# Patient Record
Sex: Female | Born: 1935 | Race: Black or African American | Hispanic: No | Smoking: Former smoker
Health system: Southern US, Community
[De-identification: ages and names within clinical notes are randomized; demographics above are authoritative.]

## PROBLEM LIST (undated history)

## (undated) DIAGNOSIS — N185 Chronic kidney disease, stage 5: Secondary | ICD-10-CM

## (undated) DIAGNOSIS — I739 Peripheral vascular disease, unspecified: Secondary | ICD-10-CM

## (undated) DIAGNOSIS — F0390 Unspecified dementia without behavioral disturbance: Secondary | ICD-10-CM

## (undated) DIAGNOSIS — R413 Other amnesia: Secondary | ICD-10-CM

## (undated) DIAGNOSIS — E119 Type 2 diabetes mellitus without complications: Secondary | ICD-10-CM

## (undated) DIAGNOSIS — I10 Essential (primary) hypertension: Secondary | ICD-10-CM

## (undated) DIAGNOSIS — H9190 Unspecified hearing loss, unspecified ear: Secondary | ICD-10-CM

## (undated) HISTORY — DX: Other amnesia: R41.3

## (undated) HISTORY — DX: Unspecified hearing loss, unspecified ear: H91.90

## (undated) HISTORY — PX: BACK SURGERY: SHX140

---

## 2004-12-20 ENCOUNTER — Ambulatory Visit: Payer: Self-pay | Admitting: Unknown Physician Specialty

## 2004-12-30 ENCOUNTER — Emergency Department: Payer: Self-pay | Admitting: Emergency Medicine

## 2004-12-30 ENCOUNTER — Other Ambulatory Visit: Payer: Self-pay

## 2005-01-16 ENCOUNTER — Ambulatory Visit: Payer: Self-pay | Admitting: Anesthesiology

## 2005-02-07 ENCOUNTER — Ambulatory Visit: Payer: Self-pay | Admitting: Anesthesiology

## 2005-03-07 ENCOUNTER — Ambulatory Visit: Payer: Self-pay | Admitting: Anesthesiology

## 2005-07-25 ENCOUNTER — Ambulatory Visit: Payer: Self-pay | Admitting: Family Medicine

## 2005-10-07 ENCOUNTER — Emergency Department: Payer: Self-pay | Admitting: Emergency Medicine

## 2005-12-03 ENCOUNTER — Ambulatory Visit: Payer: Self-pay | Admitting: Unknown Physician Specialty

## 2008-09-23 ENCOUNTER — Emergency Department: Payer: Self-pay | Admitting: Emergency Medicine

## 2008-09-30 ENCOUNTER — Ambulatory Visit: Payer: Self-pay | Admitting: Family Medicine

## 2009-01-31 ENCOUNTER — Ambulatory Visit: Payer: Self-pay | Admitting: Family Medicine

## 2009-02-21 ENCOUNTER — Ambulatory Visit: Payer: Self-pay | Admitting: Internal Medicine

## 2009-05-24 ENCOUNTER — Ambulatory Visit: Payer: Self-pay | Admitting: Family Medicine

## 2009-09-05 ENCOUNTER — Ambulatory Visit: Payer: Self-pay | Admitting: Internal Medicine

## 2010-03-19 ENCOUNTER — Ambulatory Visit: Payer: Self-pay | Admitting: Internal Medicine

## 2011-02-16 ENCOUNTER — Ambulatory Visit: Payer: Self-pay | Admitting: Family Medicine

## 2011-07-09 ENCOUNTER — Ambulatory Visit: Payer: Self-pay | Admitting: Internal Medicine

## 2011-07-09 ENCOUNTER — Observation Stay: Payer: Self-pay | Admitting: Internal Medicine

## 2011-12-20 ENCOUNTER — Emergency Department: Payer: Self-pay | Admitting: Unknown Physician Specialty

## 2011-12-20 LAB — CBC WITH DIFFERENTIAL/PLATELET
Basophil %: 0.4 %
Eosinophil #: 0 10*3/uL (ref 0.0–0.7)
HGB: 13.1 g/dL (ref 12.0–16.0)
Lymphocyte %: 26.6 %
MCHC: 32.6 g/dL (ref 32.0–36.0)
Neutrophil #: 3.5 10*3/uL (ref 1.4–6.5)
Platelet: 192 10*3/uL (ref 150–440)
RBC: 4.71 10*6/uL (ref 3.80–5.20)
RDW: 14.6 % — ABNORMAL HIGH (ref 11.5–14.5)
WBC: 5.1 10*3/uL (ref 3.6–11.0)

## 2011-12-20 LAB — URINALYSIS, COMPLETE
Nitrite: NEGATIVE
Ph: 5 (ref 4.5–8.0)
Protein: 30
RBC,UR: 23 /HPF (ref 0–5)
Specific Gravity: 1.025 (ref 1.003–1.030)
WBC UR: 105 /HPF (ref 0–5)

## 2011-12-20 LAB — BASIC METABOLIC PANEL
BUN: 25 mg/dL — ABNORMAL HIGH (ref 7–18)
Chloride: 103 mmol/L (ref 98–107)
EGFR (Non-African Amer.): 29 — ABNORMAL LOW
Glucose: 123 mg/dL — ABNORMAL HIGH (ref 65–99)
Potassium: 4.1 mmol/L (ref 3.5–5.1)
Sodium: 140 mmol/L (ref 136–145)

## 2013-02-17 ENCOUNTER — Emergency Department: Payer: Self-pay | Admitting: Unknown Physician Specialty

## 2013-09-08 LAB — COMPREHENSIVE METABOLIC PANEL
Anion Gap: 4 — ABNORMAL LOW (ref 7–16)
Bilirubin,Total: 0.4 mg/dL (ref 0.2–1.0)
Chloride: 108 mmol/L — ABNORMAL HIGH (ref 98–107)
EGFR (African American): 22 — ABNORMAL LOW
EGFR (Non-African Amer.): 19 — ABNORMAL LOW
Glucose: 124 mg/dL — ABNORMAL HIGH (ref 65–99)
Osmolality: 278 (ref 275–301)
Potassium: 4.2 mmol/L (ref 3.5–5.1)
Sodium: 136 mmol/L (ref 136–145)
Total Protein: 6.9 g/dL (ref 6.4–8.2)

## 2013-09-08 LAB — URINALYSIS, COMPLETE
Blood: NEGATIVE
Glucose,UR: NEGATIVE mg/dL (ref 0–75)
Nitrite: POSITIVE
Ph: 5 (ref 4.5–8.0)
Protein: 30
RBC,UR: 11 /HPF (ref 0–5)
Specific Gravity: 1.021 (ref 1.003–1.030)
WBC UR: 151 /HPF (ref 0–5)

## 2013-09-08 LAB — CBC
HGB: 10.9 g/dL — ABNORMAL LOW (ref 12.0–16.0)
MCH: 27.5 pg (ref 26.0–34.0)
MCHC: 33.4 g/dL (ref 32.0–36.0)
RBC: 3.96 10*6/uL (ref 3.80–5.20)
RDW: 16.6 % — ABNORMAL HIGH (ref 11.5–14.5)
WBC: 6.9 10*3/uL (ref 3.6–11.0)

## 2013-09-08 LAB — TROPONIN I: Troponin-I: <0.02 ng/mL

## 2013-09-09 ENCOUNTER — Inpatient Hospital Stay: Payer: Self-pay | Admitting: Internal Medicine

## 2013-09-10 LAB — CBC WITH DIFFERENTIAL/PLATELET
Basophil #: 0.1 10*3/uL (ref 0.0–0.1)
HGB: 11 g/dL — ABNORMAL LOW (ref 12.0–16.0)
Lymphocyte %: 25 %
MCH: 27.6 pg (ref 26.0–34.0)
MCHC: 33.8 g/dL (ref 32.0–36.0)
MCV: 82 fL (ref 80–100)
Neutrophil #: 3.6 10*3/uL (ref 1.4–6.5)
Platelet: 198 10*3/uL (ref 150–440)
RBC: 3.98 10*6/uL (ref 3.80–5.20)
RDW: 17.6 % — ABNORMAL HIGH (ref 11.5–14.5)
WBC: 5.8 10*3/uL (ref 3.6–11.0)

## 2013-09-10 LAB — BASIC METABOLIC PANEL
BUN: 25 mg/dL — ABNORMAL HIGH (ref 7–18)
Calcium, Total: 8.6 mg/dL (ref 8.5–10.1)
Co2: 24 mmol/L (ref 21–32)
Creatinine: 1.73 mg/dL — ABNORMAL HIGH (ref 0.60–1.30)
EGFR (African American): 29 — ABNORMAL LOW
EGFR (Non-African Amer.): 25 — ABNORMAL LOW
Glucose: 91 mg/dL (ref 65–99)
Potassium: 4.6 mmol/L (ref 3.5–5.1)

## 2013-09-23 ENCOUNTER — Encounter: Payer: Self-pay | Admitting: Podiatry

## 2013-09-28 ENCOUNTER — Ambulatory Visit (INDEPENDENT_AMBULATORY_CARE_PROVIDER_SITE_OTHER): Payer: Medicare Other | Admitting: Podiatry

## 2013-09-28 ENCOUNTER — Encounter: Payer: Self-pay | Admitting: Podiatry

## 2013-09-28 VITALS — BP 180/75 | HR 65 | Resp 16 | Ht 64.0 in | Wt 143.0 lb

## 2013-09-28 DIAGNOSIS — M79609 Pain in unspecified limb: Secondary | ICD-10-CM

## 2013-09-28 DIAGNOSIS — B351 Tinea unguium: Secondary | ICD-10-CM

## 2013-09-29 NOTE — Progress Notes (Signed)
Subjective:     Patient ID: Caroline Flores, female   DOB: 04-27-1920, 79 y.o.   MRN: [id number]  HPI patient presents stating I am having painful nails of both feet that I can not cut myself. Presents with caregiver   Review of Systems     Objective:   Physical Exam Neurovascular status unchanged from previous visit and thick incurvated nail bed 1-5 both feet    Assessment:     Mycotic nail infection with pain 1-5 both feet    Plan:     Debridement nails 1-5 both feet no iatrogenic bleeding noted

## 2013-12-12 ENCOUNTER — Ambulatory Visit: Payer: Self-pay | Admitting: Internal Medicine

## 2013-12-12 LAB — URINALYSIS, COMPLETE
Bilirubin,UR: NEGATIVE
Blood: NEGATIVE
Glucose,UR: NEGATIVE mg/dL (ref 0–75)
Ketone: NEGATIVE
Nitrite: NEGATIVE
PH: 7.5 (ref 4.5–8.0)
Specific Gravity: 1.01 (ref 1.003–1.030)

## 2013-12-14 LAB — URINE CULTURE

## 2013-12-28 ENCOUNTER — Ambulatory Visit: Payer: Medicare Other | Admitting: Podiatry

## 2014-01-14 ENCOUNTER — Ambulatory Visit: Payer: Medicare Other | Admitting: Podiatry

## 2014-01-21 ENCOUNTER — Emergency Department: Payer: Self-pay | Admitting: Emergency Medicine

## 2014-01-21 LAB — CBC WITH DIFFERENTIAL/PLATELET
BASOS PCT: 1.3 %
Basophil #: 0.1 10*3/uL (ref 0.0–0.1)
EOS PCT: 0.9 %
Eosinophil #: 0 10*3/uL (ref 0.0–0.7)
HCT: 33.7 % — ABNORMAL LOW (ref 35.0–47.0)
HGB: 10.9 g/dL — AB (ref 12.0–16.0)
LYMPHS ABS: 1.3 10*3/uL (ref 1.0–3.6)
LYMPHS PCT: 24.1 %
MCH: 27.2 pg (ref 26.0–34.0)
MCHC: 32.4 g/dL (ref 32.0–36.0)
MCV: 84 fL (ref 80–100)
MONO ABS: 0.5 x10 3/mm (ref 0.2–0.9)
MONOS PCT: 9 %
NEUTROS ABS: 3.4 10*3/uL (ref 1.4–6.5)
Neutrophil %: 64.7 %
Platelet: 180 10*3/uL (ref 150–440)
RBC: 4.02 10*6/uL (ref 3.80–5.20)
RDW: 16.7 % — AB (ref 11.5–14.5)
WBC: 5.2 10*3/uL (ref 3.6–11.0)

## 2014-01-21 LAB — BASIC METABOLIC PANEL
ANION GAP: 4 — AB (ref 7–16)
BUN: 24 mg/dL — ABNORMAL HIGH (ref 7–18)
CO2: 28 mmol/L (ref 21–32)
CREATININE: 1.54 mg/dL — AB (ref 0.60–1.30)
Calcium, Total: 9.2 mg/dL (ref 8.5–10.1)
Chloride: 108 mmol/L — ABNORMAL HIGH (ref 98–107)
EGFR (African American): 34 — ABNORMAL LOW
EGFR (Non-African Amer.): 29 — ABNORMAL LOW
GLUCOSE: 102 mg/dL — AB (ref 65–99)
OSMOLALITY: 284 (ref 275–301)
Potassium: 3.6 mmol/L (ref 3.5–5.1)
Sodium: 140 mmol/L (ref 136–145)

## 2014-01-21 LAB — URINALYSIS, COMPLETE
BLOOD: NEGATIVE
Bilirubin,UR: NEGATIVE
GLUCOSE, UR: NEGATIVE mg/dL (ref 0–75)
KETONE: NEGATIVE
NITRITE: POSITIVE
Ph: 6 (ref 4.5–8.0)
Protein: 30
RBC,UR: 6 /HPF (ref 0–5)
SPECIFIC GRAVITY: 1.017 (ref 1.003–1.030)
Squamous Epithelial: 1
WBC UR: 11 /HPF (ref 0–5)

## 2014-01-21 LAB — TROPONIN I: TROPONIN-I: 0.02 ng/mL

## 2014-01-24 LAB — URINE CULTURE

## 2014-01-26 LAB — CULTURE, BLOOD (SINGLE)

## 2014-02-15 ENCOUNTER — Ambulatory Visit: Payer: Medicare Other | Admitting: Podiatry

## 2014-03-24 ENCOUNTER — Inpatient Hospital Stay: Payer: Self-pay | Admitting: Internal Medicine

## 2014-03-24 LAB — URINALYSIS, COMPLETE
BILIRUBIN, UR: NEGATIVE
Bacteria: NONE SEEN
Blood: NEGATIVE
Glucose,UR: NEGATIVE mg/dL (ref 0–75)
Ketone: NEGATIVE
Leukocyte Esterase: NEGATIVE
Nitrite: NEGATIVE
PROTEIN: NEGATIVE
Ph: 5 (ref 4.5–8.0)
Specific Gravity: 1.012 (ref 1.003–1.030)
Squamous Epithelial: 1
WBC UR: NONE SEEN /HPF (ref 0–5)

## 2014-03-24 LAB — BASIC METABOLIC PANEL
Anion Gap: 7 (ref 7–16)
BUN: 68 mg/dL — ABNORMAL HIGH (ref 7–18)
Calcium, Total: 8.8 mg/dL (ref 8.5–10.1)
Chloride: 104 mmol/L (ref 98–107)
Co2: 27 mmol/L (ref 21–32)
Creatinine: 3.91 mg/dL — ABNORMAL HIGH (ref 0.60–1.30)
EGFR (African American): 11 — ABNORMAL LOW
GFR CALC NON AF AMER: 9 — AB
Glucose: 153 mg/dL — ABNORMAL HIGH (ref 65–99)
Osmolality: 298 (ref 275–301)
POTASSIUM: 4.9 mmol/L (ref 3.5–5.1)
Sodium: 138 mmol/L (ref 136–145)

## 2014-03-24 LAB — CBC WITH DIFFERENTIAL/PLATELET
Basophil #: 0 10*3/uL (ref 0.0–0.1)
Basophil %: 0.2 %
EOS ABS: 0.1 10*3/uL (ref 0.0–0.7)
Eosinophil %: 2.3 %
HCT: 31.3 % — AB (ref 35.0–47.0)
HGB: 9.9 g/dL — AB (ref 12.0–16.0)
LYMPHS PCT: 30.5 %
Lymphocyte #: 1.3 10*3/uL (ref 1.0–3.6)
MCH: 26.6 pg (ref 26.0–34.0)
MCHC: 31.7 g/dL — ABNORMAL LOW (ref 32.0–36.0)
MCV: 84 fL (ref 80–100)
MONO ABS: 0.4 x10 3/mm (ref 0.2–0.9)
MONOS PCT: 8.3 %
Neutrophil #: 2.6 10*3/uL (ref 1.4–6.5)
Neutrophil %: 58.7 %
PLATELETS: 153 10*3/uL (ref 150–440)
RBC: 3.72 10*6/uL — AB (ref 3.80–5.20)
RDW: 14.7 % — ABNORMAL HIGH (ref 11.5–14.5)
WBC: 4.4 10*3/uL (ref 3.6–11.0)

## 2014-03-24 LAB — IRON AND TIBC
IRON SATURATION: 30 %
Iron Bind.Cap.(Total): 274 ug/dL (ref 250–450)
Iron: 82 ug/dL (ref 50–170)
Unbound Iron-Bind.Cap.: 192 ug/dL

## 2014-03-24 LAB — TROPONIN I

## 2014-03-25 LAB — COMPREHENSIVE METABOLIC PANEL
ALT: 17 U/L (ref 12–78)
ANION GAP: 6 — AB (ref 7–16)
Albumin: 3.2 g/dL — ABNORMAL LOW (ref 3.4–5.0)
Alkaline Phosphatase: 49 U/L
BUN: 64 mg/dL — ABNORMAL HIGH (ref 7–18)
Bilirubin,Total: 0.3 mg/dL (ref 0.2–1.0)
CHLORIDE: 107 mmol/L (ref 98–107)
Calcium, Total: 8.7 mg/dL (ref 8.5–10.1)
Co2: 28 mmol/L (ref 21–32)
Creatinine: 3.41 mg/dL — ABNORMAL HIGH (ref 0.60–1.30)
EGFR (Non-African Amer.): 11 — ABNORMAL LOW
GFR CALC AF AMER: 13 — AB
GLUCOSE: 85 mg/dL (ref 65–99)
Osmolality: 299 (ref 275–301)
Potassium: 4.6 mmol/L (ref 3.5–5.1)
SGOT(AST): 26 U/L (ref 15–37)
Sodium: 141 mmol/L (ref 136–145)
TOTAL PROTEIN: 6.3 g/dL — AB (ref 6.4–8.2)

## 2014-03-25 LAB — HEMOGLOBIN A1C: Hemoglobin A1C: 6.4 % — ABNORMAL HIGH (ref 4.2–6.3)

## 2014-03-25 LAB — CBC WITH DIFFERENTIAL/PLATELET
BASOS ABS: 0 10*3/uL (ref 0.0–0.1)
BASOS PCT: 1.3 %
Eosinophil #: 0.1 10*3/uL (ref 0.0–0.7)
Eosinophil %: 3.2 %
HCT: 29.6 % — ABNORMAL LOW (ref 35.0–47.0)
HGB: 9.7 g/dL — ABNORMAL LOW (ref 12.0–16.0)
LYMPHS ABS: 1 10*3/uL (ref 1.0–3.6)
LYMPHS PCT: 27.6 %
MCH: 27.3 pg (ref 26.0–34.0)
MCHC: 32.8 g/dL (ref 32.0–36.0)
MCV: 83 fL (ref 80–100)
MONO ABS: 0.4 x10 3/mm (ref 0.2–0.9)
Monocyte %: 10.2 %
Neutrophil #: 2.1 10*3/uL (ref 1.4–6.5)
Neutrophil %: 57.7 %
PLATELETS: 146 10*3/uL — AB (ref 150–440)
RBC: 3.55 10*6/uL — AB (ref 3.80–5.20)
RDW: 14.7 % — ABNORMAL HIGH (ref 11.5–14.5)
WBC: 3.7 10*3/uL (ref 3.6–11.0)

## 2014-03-25 LAB — TSH: THYROID STIMULATING HORM: 4.37 u[IU]/mL

## 2014-03-25 LAB — MAGNESIUM: Magnesium: 2.2 mg/dL

## 2014-03-26 LAB — BASIC METABOLIC PANEL
Anion Gap: 5 — ABNORMAL LOW (ref 7–16)
BUN: 57 mg/dL — AB (ref 7–18)
Calcium, Total: 8.1 mg/dL — ABNORMAL LOW (ref 8.5–10.1)
Chloride: 110 mmol/L — ABNORMAL HIGH (ref 98–107)
Co2: 27 mmol/L (ref 21–32)
Creatinine: 2.96 mg/dL — ABNORMAL HIGH (ref 0.60–1.30)
EGFR (Non-African Amer.): 13 — ABNORMAL LOW
GFR CALC AF AMER: 15 — AB
GLUCOSE: 82 mg/dL (ref 65–99)
Osmolality: 298 (ref 275–301)
Potassium: 4.6 mmol/L (ref 3.5–5.1)
Sodium: 142 mmol/L (ref 136–145)

## 2014-03-27 LAB — BASIC METABOLIC PANEL
Anion Gap: 6 — ABNORMAL LOW (ref 7–16)
BUN: 53 mg/dL — ABNORMAL HIGH (ref 7–18)
CHLORIDE: 111 mmol/L — AB (ref 98–107)
CREATININE: 2.51 mg/dL — AB (ref 0.60–1.30)
Calcium, Total: 8.1 mg/dL — ABNORMAL LOW (ref 8.5–10.1)
Co2: 26 mmol/L (ref 21–32)
GFR CALC AF AMER: 19 — AB
GFR CALC NON AF AMER: 16 — AB
Glucose: 96 mg/dL (ref 65–99)
OSMOLALITY: 299 (ref 275–301)
Potassium: 4.5 mmol/L (ref 3.5–5.1)
Sodium: 143 mmol/L (ref 136–145)

## 2014-03-28 LAB — BASIC METABOLIC PANEL
ANION GAP: 7 (ref 7–16)
BUN: 52 mg/dL — ABNORMAL HIGH (ref 7–18)
CHLORIDE: 111 mmol/L — AB (ref 98–107)
CREATININE: 2.49 mg/dL — AB (ref 0.60–1.30)
Calcium, Total: 8 mg/dL — ABNORMAL LOW (ref 8.5–10.1)
Co2: 25 mmol/L (ref 21–32)
GFR CALC AF AMER: 19 — AB
GFR CALC NON AF AMER: 16 — AB
Glucose: 117 mg/dL — ABNORMAL HIGH (ref 65–99)
Osmolality: 300 (ref 275–301)
Potassium: 4.4 mmol/L (ref 3.5–5.1)
Sodium: 143 mmol/L (ref 136–145)

## 2014-03-28 LAB — PROTEIN ELECTROPHORESIS(ARMC)

## 2014-03-29 LAB — BASIC METABOLIC PANEL
Anion Gap: 5 — ABNORMAL LOW (ref 7–16)
BUN: 51 mg/dL — AB (ref 7–18)
CHLORIDE: 112 mmol/L — AB (ref 98–107)
Calcium, Total: 8.1 mg/dL — ABNORMAL LOW (ref 8.5–10.1)
Co2: 24 mmol/L (ref 21–32)
Creatinine: 2.51 mg/dL — ABNORMAL HIGH (ref 0.60–1.30)
EGFR (African American): 19 — ABNORMAL LOW
GFR CALC NON AF AMER: 16 — AB
GLUCOSE: 102 mg/dL — AB (ref 65–99)
OSMOLALITY: 295 (ref 275–301)
POTASSIUM: 4.6 mmol/L (ref 3.5–5.1)
SODIUM: 141 mmol/L (ref 136–145)

## 2014-03-29 LAB — URINALYSIS, COMPLETE
BILIRUBIN, UR: NEGATIVE
BLOOD: NEGATIVE
GLUCOSE, UR: NEGATIVE mg/dL (ref 0–75)
Ketone: NEGATIVE
Nitrite: NEGATIVE
Ph: 6 (ref 4.5–8.0)
Protein: NEGATIVE
RBC,UR: 3 /HPF (ref 0–5)
Specific Gravity: 1.017 (ref 1.003–1.030)
Squamous Epithelial: 4
WBC UR: 96 /HPF (ref 0–5)

## 2014-03-29 LAB — PLATELET COUNT: PLATELETS: 127 10*3/uL — AB (ref 150–440)

## 2014-10-11 ENCOUNTER — Ambulatory Visit: Payer: Self-pay | Admitting: Internal Medicine

## 2014-10-15 ENCOUNTER — Inpatient Hospital Stay: Payer: Self-pay | Admitting: Internal Medicine

## 2014-10-15 LAB — BASIC METABOLIC PANEL
ANION GAP: 15 (ref 7–16)
BUN: 88 mg/dL — AB (ref 7–18)
Calcium, Total: 8.9 mg/dL (ref 8.5–10.1)
Chloride: 102 mmol/L (ref 98–107)
Co2: 21 mmol/L (ref 21–32)
Creatinine: 5.44 mg/dL — ABNORMAL HIGH (ref 0.60–1.30)
EGFR (Non-African Amer.): 8 — ABNORMAL LOW
GFR CALC AF AMER: 9 — AB
Glucose: 165 mg/dL — ABNORMAL HIGH (ref 65–99)
Osmolality: 306 (ref 275–301)
POTASSIUM: 4.9 mmol/L (ref 3.5–5.1)
SODIUM: 138 mmol/L (ref 136–145)

## 2014-10-15 LAB — APTT: Activated PTT: 41.7 secs — ABNORMAL HIGH (ref 23.6–35.9)

## 2014-10-15 LAB — CBC
HCT: 31.2 % — ABNORMAL LOW (ref 35.0–47.0)
HGB: 10 g/dL — ABNORMAL LOW (ref 12.0–16.0)
MCH: 26.7 pg (ref 26.0–34.0)
MCHC: 32.2 g/dL (ref 32.0–36.0)
MCV: 83 fL (ref 80–100)
Platelet: 180 10*3/uL (ref 150–440)
RBC: 3.77 10*6/uL — ABNORMAL LOW (ref 3.80–5.20)
RDW: 16.4 % — ABNORMAL HIGH (ref 11.5–14.5)
WBC: 8.6 10*3/uL (ref 3.6–11.0)

## 2014-10-15 LAB — HEPARIN LEVEL (UNFRACTIONATED): Anti-Xa(Unfractionated): <0.1 IU/mL — ABNORMAL LOW (ref 0.30–0.70)

## 2014-10-15 LAB — PROTIME-INR
INR: 1.1
Prothrombin Time: 14.3 secs (ref 11.5–14.7)

## 2014-10-15 LAB — TROPONIN I
TROPONIN-I: 3.1 ng/mL — AB
Troponin-I: 3.4 ng/mL — ABNORMAL HIGH
Troponin-I: 3 ng/mL — ABNORMAL HIGH

## 2014-10-15 LAB — CK-MB
CK-MB: 8.7 ng/mL — ABNORMAL HIGH (ref 0.5–3.6)
CK-MB: 7.8 ng/mL — ABNORMAL HIGH (ref 0.5–3.6)
CK-MB: 7.3 ng/mL — ABNORMAL HIGH (ref 0.5–3.6)

## 2014-10-16 LAB — BASIC METABOLIC PANEL
Anion Gap: 16 (ref 7–16)
BUN: 99 mg/dL — ABNORMAL HIGH (ref 7–18)
CHLORIDE: 104 mmol/L (ref 98–107)
Calcium, Total: 8.4 mg/dL — ABNORMAL LOW (ref 8.5–10.1)
Co2: 19 mmol/L — ABNORMAL LOW (ref 21–32)
Creatinine: 5.59 mg/dL — ABNORMAL HIGH (ref 0.60–1.30)
EGFR (Non-African Amer.): 8 — ABNORMAL LOW
GFR CALC AF AMER: 9 — AB
GLUCOSE: 162 mg/dL — AB (ref 65–99)
Osmolality: 312 (ref 275–301)
POTASSIUM: 5 mmol/L (ref 3.5–5.1)
SODIUM: 139 mmol/L (ref 136–145)

## 2014-10-16 LAB — LIPID PANEL
Cholesterol: 146 mg/dL (ref 0–200)
HDL: 65 mg/dL — AB (ref 40–60)
LDL CHOLESTEROL, CALC: 70 mg/dL (ref 0–100)
TRIGLYCERIDES: 55 mg/dL (ref 0–200)
VLDL Cholesterol, Calc: 11 mg/dL (ref 5–40)

## 2014-10-16 LAB — CBC WITH DIFFERENTIAL/PLATELET
BASOS ABS: 0 10*3/uL (ref 0.0–0.1)
Basophil %: 0.6 %
EOS ABS: 0 10*3/uL (ref 0.0–0.7)
EOS PCT: 0.1 %
HCT: 28.2 % — AB (ref 35.0–47.0)
HGB: 9 g/dL — ABNORMAL LOW (ref 12.0–16.0)
Lymphocyte #: 0.5 10*3/uL — ABNORMAL LOW (ref 1.0–3.6)
Lymphocyte %: 6.2 %
MCH: 26.5 pg (ref 26.0–34.0)
MCHC: 32 g/dL (ref 32.0–36.0)
MCV: 83 fL (ref 80–100)
MONOS PCT: 3.4 %
Monocyte #: 0.3 x10 3/mm (ref 0.2–0.9)
NEUTROS ABS: 7.5 10*3/uL — AB (ref 1.4–6.5)
Neutrophil %: 89.7 %
Platelet: 170 10*3/uL (ref 150–440)
RBC: 3.41 10*6/uL — ABNORMAL LOW (ref 3.80–5.20)
RDW: 16.6 % — ABNORMAL HIGH (ref 11.5–14.5)
WBC: 8.3 10*3/uL (ref 3.6–11.0)

## 2014-10-16 LAB — HEPARIN LEVEL (UNFRACTIONATED): Anti-Xa(Unfractionated): 0.65 IU/mL (ref 0.30–0.70)

## 2014-10-16 LAB — HEMOGLOBIN A1C: HEMOGLOBIN A1C: 7.1 % — AB (ref 4.2–6.3)

## 2014-10-16 LAB — MAGNESIUM: Magnesium: 2.2 mg/dL

## 2014-10-17 LAB — BASIC METABOLIC PANEL
Anion Gap: 13 (ref 7–16)
BUN: 109 mg/dL — AB (ref 7–18)
CREATININE: 6.39 mg/dL — AB (ref 0.60–1.30)
Calcium, Total: 8.3 mg/dL — ABNORMAL LOW (ref 8.5–10.1)
Chloride: 106 mmol/L (ref 98–107)
Co2: 18 mmol/L — ABNORMAL LOW (ref 21–32)
EGFR (African American): 8 — ABNORMAL LOW
GFR CALC NON AF AMER: 6 — AB
GLUCOSE: 138 mg/dL — AB (ref 65–99)
Osmolality: 310 (ref 275–301)
POTASSIUM: 4.7 mmol/L (ref 3.5–5.1)
SODIUM: 137 mmol/L (ref 136–145)

## 2014-10-17 LAB — CBC WITH DIFFERENTIAL/PLATELET
Basophil #: 0.1 10*3/uL (ref 0.0–0.1)
Basophil #: 0 10*3/uL (ref 0.0–0.1)
Basophil %: 0.7 %
Basophil %: 0.5 %
Eosinophil #: 0.1 10*3/uL (ref 0.0–0.7)
Eosinophil #: 0.1 10*3/uL (ref 0.0–0.7)
Eosinophil %: 0.9 %
Eosinophil %: 1.6 %
HCT: 27.1 % — ABNORMAL LOW (ref 35.0–47.0)
HCT: 25.1 % — AB (ref 35.0–47.0)
HGB: 8.6 g/dL — ABNORMAL LOW (ref 12.0–16.0)
HGB: 8 g/dL — ABNORMAL LOW (ref 12.0–16.0)
LYMPHS ABS: 0.8 10*3/uL — AB (ref 1.0–3.6)
Lymphocyte #: 1 10*3/uL (ref 1.0–3.6)
Lymphocyte %: 13.4 %
Lymphocyte %: 12.1 %
MCH: 26.5 pg (ref 26.0–34.0)
MCH: 26.6 pg (ref 26.0–34.0)
MCHC: 31.9 g/dL — ABNORMAL LOW (ref 32.0–36.0)
MCHC: 31.8 g/dL — AB (ref 32.0–36.0)
MCV: 83 fL (ref 80–100)
MCV: 83 fL (ref 80–100)
MONO ABS: 0.6 x10 3/mm (ref 0.2–0.9)
Monocyte #: 0.7 x10 3/mm (ref 0.2–0.9)
Monocyte %: 9.5 %
Monocyte %: 8.3 %
NEUTROS PCT: 77.5 %
Neutrophil #: 5.8 10*3/uL (ref 1.4–6.5)
Neutrophil #: 5.4 10*3/uL (ref 1.4–6.5)
Neutrophil %: 75.5 %
Platelet: 166 10*3/uL (ref 150–440)
Platelet: 159 10*3/uL (ref 150–440)
RBC: 3.26 10*6/uL — ABNORMAL LOW (ref 3.80–5.20)
RBC: 3.01 10*6/uL — ABNORMAL LOW (ref 3.80–5.20)
RDW: 16.5 % — ABNORMAL HIGH (ref 11.5–14.5)
RDW: 16.7 % — AB (ref 11.5–14.5)
WBC: 7.6 10*3/uL (ref 3.6–11.0)
WBC: 7 10*3/uL (ref 3.6–11.0)

## 2014-10-17 LAB — RENAL FUNCTION PANEL
ALBUMIN: 3.1 g/dL — AB (ref 3.4–5.0)
Anion Gap: 18 — ABNORMAL HIGH (ref 7–16)
BUN: 115 mg/dL — AB (ref 7–18)
Calcium, Total: 8.1 mg/dL — ABNORMAL LOW (ref 8.5–10.1)
Chloride: 103 mmol/L (ref 98–107)
Co2: 17 mmol/L — ABNORMAL LOW (ref 21–32)
Creatinine: 7.08 mg/dL — ABNORMAL HIGH (ref 0.60–1.30)
EGFR (African American): 7 — ABNORMAL LOW
EGFR (Non-African Amer.): 6 — ABNORMAL LOW
GLUCOSE: 165 mg/dL — AB (ref 65–99)
Osmolality: 316 (ref 275–301)
Phosphorus: 6.9 mg/dL — ABNORMAL HIGH (ref 2.5–4.9)
Potassium: 5 mmol/L (ref 3.5–5.1)
SODIUM: 138 mmol/L (ref 136–145)

## 2014-10-18 LAB — RENAL FUNCTION PANEL
ALBUMIN: 3.2 g/dL — AB (ref 3.4–5.0)
ANION GAP: 11 (ref 7–16)
BUN: 97 mg/dL — AB (ref 7–18)
CHLORIDE: 104 mmol/L (ref 98–107)
CREATININE: 6.47 mg/dL — AB (ref 0.60–1.30)
Calcium, Total: 8.4 mg/dL — ABNORMAL LOW (ref 8.5–10.1)
Co2: 21 mmol/L (ref 21–32)
EGFR (African American): 8 — ABNORMAL LOW
EGFR (Non-African Amer.): 6 — ABNORMAL LOW
Glucose: 114 mg/dL — ABNORMAL HIGH (ref 65–99)
Osmolality: 303 (ref 275–301)
POTASSIUM: 4.6 mmol/L (ref 3.5–5.1)
Phosphorus: 6.7 mg/dL — ABNORMAL HIGH (ref 2.5–4.9)
SODIUM: 136 mmol/L (ref 136–145)

## 2014-10-19 LAB — CBC WITH DIFFERENTIAL/PLATELET
Basophil #: 0 10*3/uL (ref 0.0–0.1)
Basophil %: 0.4 %
Eosinophil #: 0.1 10*3/uL (ref 0.0–0.7)
Eosinophil %: 1.6 %
HCT: 27.1 % — ABNORMAL LOW (ref 35.0–47.0)
HGB: 8.7 g/dL — AB (ref 12.0–16.0)
LYMPHS ABS: 0.6 10*3/uL — AB (ref 1.0–3.6)
LYMPHS PCT: 8.9 %
MCH: 26.8 pg (ref 26.0–34.0)
MCHC: 32 g/dL (ref 32.0–36.0)
MCV: 84 fL (ref 80–100)
Monocyte #: 0.7 x10 3/mm (ref 0.2–0.9)
Monocyte %: 10.7 %
NEUTROS ABS: 5.3 10*3/uL (ref 1.4–6.5)
NEUTROS PCT: 78.4 %
PLATELETS: 167 10*3/uL (ref 150–440)
RBC: 3.24 10*6/uL — ABNORMAL LOW (ref 3.80–5.20)
RDW: 16.7 % — AB (ref 11.5–14.5)
WBC: 6.7 10*3/uL (ref 3.6–11.0)

## 2014-10-19 LAB — RENAL FUNCTION PANEL
Albumin: 2.9 g/dL — ABNORMAL LOW (ref 3.4–5.0)
Anion Gap: 12 (ref 7–16)
BUN: 71 mg/dL — ABNORMAL HIGH (ref 7–18)
CO2: 25 mmol/L (ref 21–32)
CREATININE: 5.83 mg/dL — AB (ref 0.60–1.30)
Calcium, Total: 8 mg/dL — ABNORMAL LOW (ref 8.5–10.1)
Chloride: 102 mmol/L (ref 98–107)
EGFR (African American): 9 — ABNORMAL LOW
EGFR (Non-African Amer.): 7 — ABNORMAL LOW
Glucose: 142 mg/dL — ABNORMAL HIGH (ref 65–99)
Osmolality: 301 (ref 275–301)
POTASSIUM: 4.4 mmol/L (ref 3.5–5.1)
Phosphorus: 5.8 mg/dL — ABNORMAL HIGH (ref 2.5–4.9)
Sodium: 139 mmol/L (ref 136–145)

## 2014-10-20 LAB — BASIC METABOLIC PANEL
Anion Gap: 9 (ref 7–16)
BUN: 36 mg/dL — ABNORMAL HIGH (ref 7–18)
CALCIUM: 8 mg/dL — AB (ref 8.5–10.1)
CHLORIDE: 103 mmol/L (ref 98–107)
CREATININE: 3.95 mg/dL — AB (ref 0.60–1.30)
Co2: 28 mmol/L (ref 21–32)
EGFR (Non-African Amer.): 11 — ABNORMAL LOW
GFR CALC AF AMER: 14 — AB
GLUCOSE: 126 mg/dL — AB (ref 65–99)
Osmolality: 289 (ref 275–301)
Potassium: 4.4 mmol/L (ref 3.5–5.1)
Sodium: 140 mmol/L (ref 136–145)

## 2014-10-20 LAB — CBC WITH DIFFERENTIAL/PLATELET
BASOS PCT: 0.3 %
Basophil #: 0 10*3/uL (ref 0.0–0.1)
Eosinophil #: 0.1 10*3/uL (ref 0.0–0.7)
Eosinophil %: 2 %
HCT: 26.2 % — AB (ref 35.0–47.0)
HGB: 8.4 g/dL — AB (ref 12.0–16.0)
LYMPHS ABS: 0.5 10*3/uL — AB (ref 1.0–3.6)
Lymphocyte %: 8.4 %
MCH: 26.8 pg (ref 26.0–34.0)
MCHC: 32.1 g/dL (ref 32.0–36.0)
MCV: 84 fL (ref 80–100)
MONO ABS: 0.8 x10 3/mm (ref 0.2–0.9)
Monocyte %: 13.1 %
NEUTROS ABS: 4.9 10*3/uL (ref 1.4–6.5)
Neutrophil %: 76.2 %
Platelet: 151 10*3/uL (ref 150–440)
RBC: 3.13 10*6/uL — AB (ref 3.80–5.20)
RDW: 16.3 % — AB (ref 11.5–14.5)
WBC: 6.4 10*3/uL (ref 3.6–11.0)

## 2014-10-21 LAB — CBC WITH DIFFERENTIAL/PLATELET
Basophil #: 0.1 10*3/uL (ref 0.0–0.1)
Basophil %: 0.8 %
Eosinophil #: 0.3 10*3/uL (ref 0.0–0.7)
Eosinophil %: 3.5 %
HCT: 30.7 % — ABNORMAL LOW (ref 35.0–47.0)
HGB: 9.6 g/dL — ABNORMAL LOW (ref 12.0–16.0)
Lymphocyte #: 0.8 10*3/uL — ABNORMAL LOW (ref 1.0–3.6)
Lymphocyte %: 10.2 %
MCH: 26.4 pg (ref 26.0–34.0)
MCHC: 31.2 g/dL — AB (ref 32.0–36.0)
MCV: 85 fL (ref 80–100)
Monocyte #: 0.6 x10 3/mm (ref 0.2–0.9)
Monocyte %: 7.6 %
Neutrophil #: 6.2 10*3/uL (ref 1.4–6.5)
Neutrophil %: 77.9 %
PLATELETS: 194 10*3/uL (ref 150–440)
RBC: 3.63 10*6/uL — AB (ref 3.80–5.20)
RDW: 16.7 % — ABNORMAL HIGH (ref 11.5–14.5)
WBC: 7.9 10*3/uL (ref 3.6–11.0)

## 2014-10-21 LAB — RENAL FUNCTION PANEL
ALBUMIN: 2.8 g/dL — AB (ref 3.4–5.0)
Anion Gap: 9 (ref 7–16)
BUN: 44 mg/dL — AB (ref 7–18)
CALCIUM: 8.5 mg/dL (ref 8.5–10.1)
Chloride: 100 mmol/L (ref 98–107)
Co2: 28 mmol/L (ref 21–32)
Creatinine: 5.28 mg/dL — ABNORMAL HIGH (ref 0.60–1.30)
EGFR (African American): 10 — ABNORMAL LOW
GFR CALC NON AF AMER: 8 — AB
Glucose: 131 mg/dL — ABNORMAL HIGH (ref 65–99)
OSMOLALITY: 287 (ref 275–301)
PHOSPHORUS: 4.9 mg/dL (ref 2.5–4.9)
POTASSIUM: 4.5 mmol/L (ref 3.5–5.1)
Sodium: 137 mmol/L (ref 136–145)

## 2014-10-22 LAB — CBC WITH DIFFERENTIAL/PLATELET
BASOS ABS: 0 10*3/uL (ref 0.0–0.1)
Basophil %: 0.7 %
Eosinophil #: 0.2 10*3/uL (ref 0.0–0.7)
Eosinophil %: 2.9 %
HCT: 29.5 % — AB (ref 35.0–47.0)
HGB: 9.3 g/dL — AB (ref 12.0–16.0)
LYMPHS PCT: 13.6 %
Lymphocyte #: 0.9 10*3/uL — ABNORMAL LOW (ref 1.0–3.6)
MCH: 26.5 pg (ref 26.0–34.0)
MCHC: 31.5 g/dL — AB (ref 32.0–36.0)
MCV: 84 fL (ref 80–100)
MONOS PCT: 10.9 %
Monocyte #: 0.7 x10 3/mm (ref 0.2–0.9)
NEUTROS ABS: 4.8 10*3/uL (ref 1.4–6.5)
NEUTROS PCT: 71.9 %
Platelet: 182 10*3/uL (ref 150–440)
RBC: 3.51 10*6/uL — ABNORMAL LOW (ref 3.80–5.20)
RDW: 16.5 % — AB (ref 11.5–14.5)
WBC: 6.7 10*3/uL (ref 3.6–11.0)

## 2014-10-22 LAB — RENAL FUNCTION PANEL
ALBUMIN: 2.4 g/dL — AB (ref 3.4–5.0)
Anion Gap: 8 (ref 7–16)
BUN: 27 mg/dL — ABNORMAL HIGH (ref 7–18)
CALCIUM: 8 mg/dL — AB (ref 8.5–10.1)
CHLORIDE: 102 mmol/L (ref 98–107)
Co2: 31 mmol/L (ref 21–32)
Creatinine: 4.06 mg/dL — ABNORMAL HIGH (ref 0.60–1.30)
EGFR (African American): 13 — ABNORMAL LOW
EGFR (Non-African Amer.): 11 — ABNORMAL LOW
Glucose: 104 mg/dL — ABNORMAL HIGH (ref 65–99)
Osmolality: 287 (ref 275–301)
Phosphorus: 2.6 mg/dL (ref 2.5–4.9)
Potassium: 4.1 mmol/L (ref 3.5–5.1)
Sodium: 141 mmol/L (ref 136–145)

## 2014-11-11 ENCOUNTER — Ambulatory Visit: Payer: Self-pay | Admitting: Internal Medicine

## 2015-03-03 NOTE — H&P (Signed)
PATIENT NAME:  Caroline Flores, Caroline Flores MR#:  [id number] DATE OF BIRTH:  November 06, 1920  DATE OF ADMISSION:  09/09/2013  PRIMARY CARE PHYSICIAN: At [facility].   CHIEF COMPLAINT: Fall, back pain, nausea, vomiting.   HISTORY OF PRESENT ILLNESS:  Caroline Flores is a 81 year old African-American female with history of hypertension, diabetes mellitus, dementia who lives at home with the help of her children. Walks around independently; had a fall about three days back while trying to reach some object and fell backwards. On the day of the fall, the patient did not have much complaints; however, the patient continued to have worsening of the back pain, unable to stand up. The patient has been in the wheelchair for the past 2 days. Has been having nausea, vomiting, unable to keep down any food. They are concerned about the back pain and unable to keep down the food, the patient is brought to the Emergency Department. Work-up in the Emergency Department, the patient was found to have L3 compression fracture. The patient is also found to have urinary tract infection. The patient has history of chronic kidney disease with a baseline creatinine of 1.7 which has worsened to 2.2. The patient received one dose of Rocephin in the Emergency Department.   PAST MEDICAL HISTORY: 1.  Hypertension.  2.  Diabetes mellitus.  3.  Dementia.  4.  Chronic kidney disease.   ALLERGIES: No known drug allergies.   HOME MEDICATIONS: 1.  Zyrtec 5 mg once a day.  2.  Trazodone 50 mg at bedtime.  3.  Metoprolol 25 mg 2 times a day.  4.  Metformin 500 mg 2 times a day.  5.  Calcium carbonate once a day. 6.  Benazepril 40 mg once a day.  7.  Aspirin 81 mg once a day.  8.  Aricept 5 mg once a day.  9.  Alprazolam .50 mg 3 times a day.   SOCIAL HISTORY: No history of smoking, drinking alcohol or using illicit drugs. Lives with her son.   FAMILY HISTORY: Noncontributory at the age of 66; however, strong family history of  hypertension.   REVIEW OF SYSTEMS: Difficult to obtain review of systems from the patient, as the patient  has dementia, as well as very hard of hearing. I reviewed with the patient's son and found to be negative except as mentioned in the history of present illness.   PHYSICAL EXAMINATION: GENERAL: This is a well-built, well-nourished, age-appropriate female lying down in the bed, not in distress.  VITAL SIGNS: Temperature 98.7, pulse 85, blood pressure 174/90, respiratory rate of 16, oxygen saturation is 98% on room air.  HEENT: Head: normocephalic, atraumatic. Eyes: No sclerae icterus. Conjunctivae normal. Pupils equal and reactive. The patient was not cooperative to check the extraocular movements. Mucous membranes dry. Could not examine the oropharynx.  NECK: Supple. No lymphadenopathy. No JVD. No carotid bruit. No thyromegaly.  CHEST: Has no focal tenderness.  LUNGS: Bilaterally clear to auscultation.  HEART: S1, S2 regular, diastolic and systolic murmur radiating to the neck. No pedal edema. Pulses 2+.  ABDOMEN: Bowel sounds present. Soft, nontender, nondistended. No hepatosplenomegaly.  SKIN: No rash or lesions.  MUSCULOSKELETAL: Good range of motion in all the extremities.  NEUROLOGIC: The patient is alert, oriented to self and to person. Not able to tell the place and time. No apparent cranial nerve abnormalities. Moving all four extremities. Motor 5/5 in upper and lower extremities. Could not examine the sensory.   LABORATORY DATA: Urinalysis 3+ leukocyte  esterase, WBCs of 151, bacteria 3+.   Troponins are negative.   CBC: WBC of 6.9, hemoglobin 10.9, platelet count of 202.   CMP:  BUN 25, creatinine of 2.4; The rest of all of the values are within normal limits.   ASSESSMENT AND PLAN: Ms. Caroline Flores is a 81 year old female who was brought to the Emergency Department for fall, confusion, back pain.  1.  L3 compression fracture. Will consult orthopedic surgery in the morning,  involve the physical therapy after the orthopedic evaluation. The patient will benefit having a lumbar brace.  2.  Urinary tract infection. Obtain urine cultures. Continue with Rocephin and follow up with urine cultures. De-escalate the antibiotics once culture data is available.  3.  Acute on chronic renal insufficiency: Could be secondary to dehydration from nausea and vomiting. Continue with IV fluids and follow up.  4.  Diabetes mellitus. Hold the metformin, considering the patient has renal insufficiency. Keep the patient on sliding scale insulin  5.  Hypertension, poorly-controlled. We will continue to hold medications and also keep the patient on hydralazine.  6.  Hard of hearing.   7.  Anemia, most likely from the chronic kidney disease  8.  Keep the patient on deep vein thrombosis prophylaxis with Lovenox.   TIME SPENT: 50 minutes.     ____________________________ Susa GriffinsPadmaja Naela Nodal, MD pv:NTS D: 09/09/2013 01:40:00 ET T: 09/09/2013 02:19:22 ET JOB#: [id number]  cc: Susa GriffinsPadmaja Darria Corvera, MD, <Dictator> Susa GriffinsPADMAJA Lamontae Ricardo MD ELECTRONICALLY SIGNED 09/11/2013 3:20

## 2015-03-03 NOTE — Consult Note (Signed)
Brief Consult Note: Diagnosis: L3 compression fracture.   Patient was seen by consultant.   Comments: Ms. Caroline Flores is a 81 year old African-American female who lives at home with the help of her children. Patient ambulates with use of an assistive device.  States that she occiasionally will lean on a broom while sweeping.  She feels that she has a good support system  at home. She had fallen backwards about three days ago. She did not complain of pain on day of fall but over the next two days, she had significant worsening of pain and had to use a wheelchair for transport. She has had nausea and vomiting, possibly secondary to pain. Presented to ED with L3 compression fracture seen on xray.  Denies any back pain or leg pain.  Wants to go home.  PE:  Patient is comfortably sitting on edge of bed finishing lunch.Moves with ease.  Stands and ambulates independently - refusing assistance due to lack of pain. Excellent strength hip abduction, adduction, flexion, extension, knee extension and flexion.  Right foot/ankle has limited mobility - possible partial foot drop that patient states is chronic.  Able to dorsiflex her toes.  Sensation grossly intact L1-S2.  Midline spine is non-tender.  Xray - age indeterminate L3 compression fracture  Plan - Doing very well, normal exam except decreased mobility of right foot/ankle which is baseline per patient. Denies pain in back or legs. Readily moves and ambulates without any distress.  Recommend PT for WBAT.  Patient may benefit from use of a cane or walker for balance.    Follow up in our office in one week.  If patient has any back pain, will conisder MRI and kyphoplasty.  With current clinical picture, this is unlikely..  Electronic Signatures: Murlean Harkamasunder, Bain Whichard (MD)  (Signed 30-Oct-14 12:13)  Authored: Brief Consult Note   Last Updated: 30-Oct-14 12:13 by Murlean Harkamasunder, Edona Schreffler (MD)

## 2015-03-03 NOTE — Discharge Summary (Signed)
PATIENT NAME:  Caroline Flores, Caroline Flores MR#:  [id number] DATE OF BIRTH:  04-Feb-1920  DATE OF ADMISSION:  09/09/2013 DATE OF DISCHARGE:  09/10/2013  ADMITTING DIAGNOSIS: Fall, back pain.   DISCHARGE DIAGNOSES: 1.  Fall and back pain due to L3 compression fracture, which was minimal. Seen by orthopedic surgery, who recommended outpatient followup.  2.  Urinary tract infection.  3.  Acute renal insufficiency on chronic kidney disease, felt to be due to dehydration.  4.  Diabetes.  5.  Hypertension.  6.  Hard of hearing.  7.  Anemia, likely due to anemia of chronic disease.   CONSULTANTS: Dr.  Claris Gladdenamasunder.   PERTINENT LABS AND EVALUATIONS: Admitting glucose 124, BUN 25, creatinine 2.24, sodium 136, potassium 4.2, chloride 108. CO2 was 24. Calcium was 8.7. LFTs were normal. Troponin less than 0.02. WBC 6.9, hemoglobin 10.9, platelet count 202.  Lumbar spine x-rays showed compression deformity of L3, age indeterminate.   HOSPITAL COURSE: Please refer to H and P done by the admitting physician. The patient is a 81 year old African American female with history of hypertension, diabetes, dementia, who lives at home with help of her children, who presented after she had fallen and was complaining of back pain. She was seen in the ED and was noted to have an L3 compression fracture. She was also noted to have acute renal failure on chronic kidney disease. The patient was given IV fluids. A PT evaluation was done, and she was seen by orthopedics.  The patient's renal function has improved. She was seen by ortho. They recommended no intervention, and recommended outpatient followup. The patient was seen by PT and able to ambulate with a walker, so prescription for walker has been also given. At this time, the patient is stable for discharge.   DISCHARGE MEDICATIONS:  Benazepril 40 daily, metoprolol tartrate 25 mg 1 tab p.o. b.i.d., aspirin 81 mg 1 tab p.o. daily, calcium carbonate 400 daily, Zyrtec 5 mg at bedtime,  Aricept 5 mg at bedtime, hydralazine 50 t.i.d., trazodone 50 mg 1 tab p.o. at bedtime, Cipro 500 mg 1 tab p.o. q.12 x 4 days.   HOME OXYGEN: None.   DIET: Low-sodium, carbohydrate-controlled.   ACTIVITY: As tolerated.   FOLLOWUP: With primary M.D. in 1 to 2 weeks. Follow with Dr. Claris Gladdenamasunder in 2 to 4 weeks of [facility].   32 minutes spent on the discharge.     ____________________________ Lacie ScottsShreyang H. Allena KatzPatel, MD shp:dmm D: 09/10/2013 12:57:00 ET T: 09/10/2013 13:22:21 ET JOB#: [id number]  cc: Taraann Olthoff H. Allena KatzPatel, MD, <Dictator> Charise CarwinSHREYANG H Sheneika Walstad MD ELECTRONICALLY SIGNED 09/19/2013 13:16

## 2015-03-04 NOTE — H&P (Signed)
PATIENT NAME:  Caroline Flores, Caroline Flores MR#:  [id number] DATE OF BIRTH:  March 06, 1920  DATE OF ADMISSION:  10/15/2014  ADDENDUM:   I called the patient's family members, but could not reach the patient's son or the grandson. We will keep the patient on Full Code at this time, and try to reach the patient's family members later.    ____________________________ Shaune PollackQing Graviela Nodal, MD qc:MT D: 10/15/2014 13:03:54 ET T: 10/15/2014 13:20:17 ET JOB#: [id number]  cc: Shaune PollackQing Yailen Zemaitis, MD, <Dictator> Shaune PollackQING Briyana Badman MD ELECTRONICALLY SIGNED 10/15/2014 17:06

## 2015-03-04 NOTE — H&P (Signed)
PATIENT NAME:  Caroline Flores, Caroline Flores MR#:  [id number] DATE OF BIRTH:  [birth year]  DATE OF ADMISSION:  10/15/2014  PRIMARY CARE PHYSICIAN: Angus PalmsSionne George, MD  CHIEF COMPLAINT: High blood pressure.   HISTORY OF PRESENT ILLNESS: A 81 year old African American female with a history of hypertension, diabetes, CKD, was sent from the nursing home to the ED due to high blood pressure. The patient is demented, denies any symptoms, uncooperative, unable to obtain any information. According to the nursing home documents, the patient's blood pressure was high at 240/112, so the patient was sent to the ED for further evaluation. The patient was noted to have elevated troponin and creatinine, and was started on a heparin drip in the ED.   PAST MEDICAL HISTORY: Hypertension, diabetes 2, dementia, CKD stage III-IV, anemia of chronic disease, and acute renal failure.   SOCIAL HISTORY: Nursing home resident. No alcohol drinking, smoking or illicit drugs according to previous documents.   FAMILY HISTORY: Hypertension. No CVA or cancer.   PAST SURGICAL HISTORY: None.   REVIEW OF SYSTEMS: Unable to obtain due to the patient's dementia status.   ALLERGIES: SHELLFISH.   HOME MEDICATIONS: Trazodone 50 mg p.o. at bedtime, sertraline 50 mg p.o. in the morning, Senna Lax 8.6 mg p.o. daily, Robitussin 100 mg per 5 mL to take 5 mL every 6 hours p.r.n. for cough, mirtazapine 15 mg p.o. at bedtime, Milk of Magnesium 8% oral suspension to take 30 mL once a day at bedtime p.r.n. for constipation, Maalox 400 mg/400 mg/40 mg per 5 mL suspension to take 5 mL every 6 hours p.r.n. for indigestion and heartburn, Imdur 120 mg p.o. daily, fluticasone nasal 50 mcg to take 2 sprays to each nostril once a day, fexofenadine 150 mg p.o. daily, clonidine 0.2 mg p.o. b.i.d., aspirin 81 mg p.o. daily, Aricept 5 mg p.o. at bedtime, Norvasc 10 mg p.o. daily, alprazolam 0.25 mg once a day at bedtime p.r.n. for sleep.   PHYSICAL EXAMINATION:  VITAL  SIGNS: Temperature 98.3, blood pressure 203/84, pulse 71, oxygen saturation 90% on 2 L of oxygen.  GENERAL: The patient is demented; does not follow commands, in no acute distress.  HEENT: Pupils are round, equal and reactive to light. No discharge from the ear or nose. Moist oral mucosa.  NECK: Supple. No JVD or carotid bruit. No lymphadenopathy. No thyromegaly.  CARDIOVASCULAR: S1, S2. Regular rate and rhythm. No murmurs, rubs, or gallops.  PULMONARY: Bilateral air entry. No wheezing or rales. No use of accessory muscles to breathe.  ABDOMEN: Soft. No distention. No tenderness. No organomegaly. Bowel sounds present.  EXTREMITIES: No edema, clubbing or cyanosis. No calf tenderness. Bilateral pedal pulses present.  SKIN: No rash or jaundice.  NEUROLOGIC: The patient is demented and does not follow commands. Unable to examine at this time.   LABORATORY DATA: CK-MB 7.8. Troponin 3.4, and second one 3.0. WBC 8.6, hemoglobin 10, platelets 180,000. Glucose 165, BUN 88, creatinine 5.44. Electrolytes are normal. INR 1.1. EKG showed normal sinus rhythm at 73 BPM.   IMPRESSIONS:  1.  Acute renal failure on chronic kidney disease.  2.  Elevated troponin, possibly due to acute renal failure; no signs of acute coronary syndrome.  3.  Malignant hypertension.  4.  Anemia of chronic disease.  5.  Diabetes.   PLAN OF TREATMENT:  1.  The patient will be admitted to telemetry. We will continue telemetry monitor.  2.  For acute renal failure on CKD, we will start normal saline IV and  get an ultrasound of the kidneys and bladder. Nephrology consult. Follow up BMP.  3.  For elevated troponin, which is possibly due to acute renal failure, the patient was started on heparin drip and I started aspirin, Plavix and a statin. I discussed with Dr. Lady Gary, who suggested the patient has elevated troponin which is possibly due to acute renal failure. He suggested continuing the heparin drip for 24 hours and then discontinue,  and continue current treatment. No further invasive workup. He suggests continuing aspirin and Imdur, decreasing Lopressor to 12.5 mg b.i.d., continuing Plavix and statin. Further recommendations depending on patient's condition.  4.  For malignant hypertension, we will continue the patient's home hypertension medication with IV hydralazine p.r.n.  5.  I discussed the patient's condition with the nurse and with Dr. Lady Gary.   CODE STATUS: From previous documents, the patient is a Full Code. So far, no document of a DNR, so we will keep the patient on Full Code.   TIME SPENT: About 56 minutes.    ____________________________ Shaune Pollack, MD qc:MT D: 10/15/2014 13:01:29 ET T: 10/15/2014 13:34:56 ET JOB#: [id number]  cc: Shaune Pollack, MD, <Dictator> Shaune Pollack MD ELECTRONICALLY SIGNED 10/15/2014 17:06

## 2015-03-04 NOTE — Op Note (Signed)
PATIENT NAME:  Caroline Flores, [name] MR#:  [id number] DATE OF BIRTH:  23-Nov-1919  DATE OF PROCEDURE:  10/17/2014  PREOPERATIVE DIAGNOSES: 1. End-stage renal disease.  2. Dementia.  3. Severe hypertension.   POSTOPERATIVE DIAGNOSES: 1. End-stage renal disease. 2. Dementia. 3. Severe hypertension.  PROCEDURES: 1.  Ultrasound guidance for vascular access to left internal jugular vein.  2.  Fluoroscopic guidance for placement of catheter.  3. Placement of a 24 cm tip-to-cuff tunneled hemodialysis catheter via the left internal jugular vein.   SURGEON:  Annice NeedyJason S. Dew, MD   BLOOD LOSS: 50 mL.   INDICATION FOR PROCEDURE: A 81 year old female with known severe chronic kidney disease, admitted with malignant hypertension and now found to be in end-stage renal failure.  She needs a dialysis catheter to initiate dialysis at this time. She was demented.  Consent was obtained from the niece.   DESCRIPTION OF THE PROCEDURE: The patient was brought to the vascular and interventional radiology suite. The patient's left neck and chest were sterilely prepped and draped and a sterile surgical field was created. The left internal jugular vein was visualized with ultrasound and found to be patent. It was then accessed under direct ultrasound guidance and a permanent image was recorded. A wire was placed. After a skin nick and dilatation, the peel-away sheath was placed over the wire.   I then turned my attention to an area under the clavicle. Approximately 2 fingerbreadths below the clavicle a small counter incision was created and we tunneled from the subclavicular incision to the access site. Using fluoroscopic guidance, a 24 cm tip-to-cuff tunneled hemodialysis catheter was selected, tunneled from the subclavicular incision to the access site. It was then placed through the peel-away sheath and the peel-away sheath was removed. The catheter tips were parked in the right atrium. The appropriate distal connectors  were placed. It withdrew blood well and flushed easily with heparinized saline and a concentrated heparin solution was then placed. It was secured to the chest wall with 2 Prolene sutures. The access incision was closed with a single 4-0 Monocryl. A 4-0 Monocryl pursestring suture was placed around the exit site. Sterile dressings were placed.   The patient tolerated the procedure well and was taken to the recovery room in stable condition.     ____________________________ Annice NeedyJason S. Dew, MD jsd:ap D: 10/17/2014 16:20:25 ET T: 10/17/2014 16:31:55 ET JOB#: [id number]  cc: Annice NeedyJason S. Dew, MD, <Dictator> Annice NeedyJASON S DEW MD ELECTRONICALLY SIGNED 11/10/2014 14:18

## 2015-03-04 NOTE — Discharge Summary (Signed)
PATIENT NAME:  Caroline Flores, Caroline Flores MR#:  [id number] DATE OF BIRTH:  Apr 09, 1920  DATE OF ADMISSION:  10/15/2014 DATE OF DISCHARGE:  10/24/2014   ADDENDUM: This is an addendum to earlier dictated discharge summary on 10/23/2014 by Dr. Imogene Burnhen.      HOSPITAL COURSE:  After 10/23/2014, as mentioned above, the patient had worsening of renal failure and started on hemodialysis newly by placing a Perm-a-Cath that and was waiting to arrange hemodialysis as outpatient.  We made arrangement today and able [facility].  Blood pressure remained stable.  For further details, please see discharge summary done by Dr. Imogene Burnhen on 10/23/2014.    ____________________________ Hope PigeonVaibhavkumar G. Elisabeth PigeonVachhani, MD vgv:DT D: 10/24/2014 12:22:05 ET T: 10/24/2014 12:39:45 ET JOB#: [id number]  cc: Hope PigeonVaibhavkumar G. Elisabeth PigeonVachhani, MD, <Dictator>  Altamese DillingVAIBHAVKUMAR Sephiroth Mcluckie MD ELECTRONICALLY SIGNED 10/26/2014 21:44

## 2015-03-04 NOTE — Discharge Summary (Signed)
PATIENT NAME:  Caroline Flores, Caroline Flores MR#:  [id number] DATE OF BIRTH:  11/07/1920  DATE OF ADMISSION:  03/24/2014 DATE OF DISCHARGE:  03/29/2014  ADMITTING PHYSICIAN: Felipa Furnace, MD  DISCHARGING PHYSICIAN: Enid Baas, MD  PRIMARY PHYSICIAN: Nurse practitioner  CONSULTATIONS IN THE HOSPITAL: Nephrology consultation with Dr. Thedore Mins.   DISCHARGE DIAGNOSES:  1. Acute renal failure.  2. Chronic kidney disease stage IV.  3. Malignant hypertension.  4. Dementia.  5. Diet-controlled diabetes mellitus.  6. Urinary tract infection.  DISCHARGE HOME MEDICATIONS:  1. Senokot 1 tablet p.o. daily.  2. Loperamide 2 mg 4 times a day as needed for diarrhea.  3. Robitussin 5 mL q.6 hours p.r.n. for cough.  4. Maalox 5 mL q.6 hours p.r.n. for indigestion.  5. Tylenol 500 mg p.o. q.4 hours p.r.n. for pain.  6. Milk of Magnesia 30 mL daily at bedtime as needed for constipation.  7. Aspirin 81 mg p.o. daily.  8. Fexofenadine 180 mg p.o. daily.  9. Flonase 50 mcg 2 sprays to each nostril once a day.  10. Imdur 120 mg p.o. daily.  11. Sertraline 50 mg p.o. daily.  12. Remeron 15 mg at bedtime.  13. Amlodipine 10 mg p.o. daily.  14. Aricept 5 mg p.o. daily.  15. Melatonin 1 mg p.o. daily.  16. Trazodone 50 mg p.o. at bedtime.  17. Xanax 0.25 mg once a day as needed for anxiety or sleep.  18. Hydralazine 100 mg p.o. 3 times a day.  19. Clonidine 0.2 mg p.o. twice a day.  20. Keflex 500 mg p.o. b.i.d. for 5 days.   DISCHARGE DIET: Low-sodium, ADA 1800 calorie diet.   DISCHARGE ACTIVITY: As tolerated.    FOLLOWUP INSTRUCTIONS:  1. Home health.  2. Nephrology followup in 1 week.  3. PCP followup in 1 to 2 weeks.   LABORATORIES AND IMAGING STUDIES PRIOR TO DISCHARGE: Leukocyte esterase is 3+ on urinalysis done on 03/29/2014, bacteria trace, WBC greater than 96.  Sodium 143, potassium 4.4, chloride 111, bicarbonate 25, BUN 52, creatinine 2.49, glucose 117 and calcium of 8.0.  WBC  3.7, hemoglobin 9.7, hematocrit 29.6, platelet count 146.  Renal ultrasound showing atrophic right renal kidney. Left kidney appears normal. No hydronephrosis. Moderately distended gallbladder seen.   BRIEF HOSPITAL COURSE: Ms. Neuhaus is a 81 year old elderly African-American female with history of dementia, CKD stage IV, baseline creatinine around 1.7 to 2.2, hypertension, diabetes, who presented to the hospital as sent in from PCP due to abnormal labs.   1. Acute on chronic renal failure. The patient's creatinine was greater than 2.3 on admission. She was taking benazepril and Lasix at home. Renal ultrasound showed atrophic right renal kidney, no hydronephrosis or obstruction. Seen by nephrologist in the hospital. Nephrotoxic medications were held. The patient was given gentle hydration. Creatinine remained stable at 2.5. With her age and underlying medical problems, there is a chance that her kidney function might deteriorate in the near future. Her niece will be taking care of her, so all the instructions were given to the niece. Her Lasix and benazepril are on hold at the time of discharge.  2. Urinary tract infection. The patient was having some dysuria at the time of discharge, and repeat UA showed 3+ leukocyte esterase, so antibiotic, Keflex, was called in for 5 days.  3. Dementia. The patient is pleasantly confused and was at baseline at the time of discharge.  4. Malignant hypertension. Medications were adjusted in the hospital. Metoprolol was stopped due to  bradycardia. Benazepril and Lasix were held due to acute renal failure, so the patient is being discharged on Imdur, amlodipine, hydralazine and clonidine.  5. Her course has been otherwise uneventful in the hospital.   DISCHARGE CONDITION: Stable.   DISCHARGE DISPOSITION: Home with home health.   TIME SPENT ON DISCHARGE: 45 minutes.   ____________________________ Enid Baasadhika Trevor Duty, MD rk:lb D: 03/30/2014 13:14:38  ET T: 03/30/2014 13:39:44 ET JOB#: [id number]  cc: Enid Baasadhika Amandeep Hogston, MD, <Dictator> Enid BaasADHIKA Blessed Girdner MD ELECTRONICALLY SIGNED 04/07/2014 12:26

## 2015-03-04 NOTE — Consult Note (Signed)
CHIEF COMPLAINT and HISTORY:  Subjective/Chief Complaint renal failure, severe hypertension   History of Present Illness Patient admitted two days ago from SNF for malignant HTN.  Had known stage 4 CKD and now has progressed to renal failure.  Nephrology service discussed with family who desire dialysis.  We are consulted to place a dialysis catheter.  She is demented and provides no history.   PAST MEDICAL/SURGICAL HISTORY:  Past Medical History:   Kidney Failure:    Other, see comments:    OTHER:    Other, see comments:    UTI:    constipation:    dementia:    Hypertension:    Diabetes:    Other, see comments:   ALLERGIES:  Allergies:  Shellfish: Unknown  HOME MEDICATIONS:  Home Medications: Medication Instructions Status  Aricept 5 mg oral tablet 1 tab(s) orally once a day (at bedtime) Active  amLODIPine 10 mg oral tablet 1 tab(s) orally once a day Active  ALPRAZolam 0.25 mg oral tablet 1 tab(s) orally once a day, As Needed - for Inability to Sleep - for Anxiety, Nervousness, for Inability to Sleep Active  mirtazapine 15 mg oral tablet 1 tab(s) orally once a day (at bedtime) Active  sertraline 50 mg oral tablet 1 tab(s) orally once a day (in the morning) Active  traZODone 50 mg oral tablet 1 tab(s) orally once a day (at bedtime) Active  isosorbide mononitrate extended release 120 mg oral tablet, extended release 1 tab(s) orally once a day Active  cloNIDine 0.2 mg oral tablet 1 tab(s) orally 2 times a day Active  Senna Lax 8.6 mg oral tablet 1 tab(s) orally once a day Active  Robitussin 100 mg/5 mL oral liquid 5 milliliter(s) orally every 6 hours, As Needed for cough Active  Maalox 400 mg-400 mg-40 mg/5 mL oral suspension 5 milliliter(s) orally every 6 hours, As Needed - for Indigestion, Heartburn Active  Milk of Magnesia 8% oral suspension 30 milliliter(s) orally once a day (at bedtime), As Needed - for Constipation Active  Aspirin Enteric Coated 81 mg oral delayed  release tablet 1 tab(s) orally once a day Active  fexofenadine 180 mg oral tablet 1 tab(s) orally once a day Active  fluticasone nasal 50 mcg/inh nasal spray 2 spray(s) into each nostril once a day Active   Family and Social History:  Family History Non-Contributory   Social History negative tobacco, negative ETOH, negative Illicit drugs   Place of Living Nursing Home   Review of Systems:  ROS Pt not able to provide ROS   Medications/Allergies Reviewed Medications/Allergies reviewed   Physical Exam:  GEN well developed, well nourished, chronically ill appearing, neck contracted to the right   HEENT pink conjunctivae, moist oral mucosa   NECK No masses  neck contracted to the right   RESP normal resp effort  no use of accessory muscles   CARD irregular rate  no JVD   VASCULAR ACCESS none at current   ABD denies tenderness  soft  normal BS   GU foley catheter in place  clear yellow urine draining   LYMPH negative neck, negative axillae   EXTR negative cyanosis/clubbing, positive edema   SKIN No rashes, skin turgor poor   NEURO difficult to assess with dementia.  Neck contracted, seems to move all extremities   PSYCH poor insight, lethargic   LABS:  Laboratory Results: Cardiology:    05-Dec-15 05:51, ED ECG  Ventricular Rate 73  Atrial Rate 73  P-R Interval 168  QRS Duration  88  QT 464  QTc 511  P Axis 45  R Axis -6  T Axis -71  ECG interpretation   Normal sinus rhythm  Septal infarct , age undetermined  T wave abnormality, consider inferior ischemia  Prolonged QT  Abnormal ECG  When compared with ECG of 24-Mar-2014 22:04,  Significant changes have occurred  ----------unconfirmed----------  Confirmed by OVERREAD, NOT ([id number]), editor PEARSON, BARBARA ([id number]) on 10/17/2014 2:58:32 PM  ED ECG   Routine Chem:    05-Dec-15 62:70, Basic Metabolic Panel (w/Total Calcium)  Glucose, Serum 165  BUN 88  Creatinine (comp) 5.44  Sodium, Serum 138  Potassium,  Serum 4.9  Chloride, Serum 102  CO2, Serum 21  Calcium (Total), Serum 8.9  Anion Gap 15  Osmolality (calc) 306  eGFR (African American) 9  eGFR (Non-African American) 8  eGFR values <31m/min/1.73 m2 may be an indication of chronic  kidney disease (CKD).  Calculated eGFR, using the MRDR Study equation, is useful in   patients with stable renal function.  The eGFR calculation will not be reliable in acutely ill patients  when serum creatinine is changing rapidly. It is not useful in  patients on dialysis. The eGFR calculation may not be applicable  to patients at the low and high extremes of body sizes, pregnant  women, and vegetarians.    05-Dec-15 05:36, Troponin I  Result Comment   TROPONIN - RESULTS VERIFIED BY REPEAT TESTING.   - C/DAWN TULLOCH AT 0350012/5/15.[facility]   - READ-BACK PROCESS PERFORMED.   Result(s) reported on 15 Oct 2014 at 06:16AM.    05-Dec-15 09:32, Troponin I  Result Comment   TROPONIN - RESULTS VERIFIED BY REPEAT TESTING.   - PREVIOUSLY CALLED AT 0612 ON 10/15/14   - [facility]   Result(s) reported on 15 Oct 2014 at 10:14AM.    05-Dec-15 13:33, Troponin I  Result Comment   TROPONIN - RESULTS VERIFIED BY REPEAT TESTING.   - RESULT PREV CALLED@0612  ON12/05/15/[facility]   Result(s) reported on 15 Oct 2014 at 02:27PM.    06-Dec-15 093:81 Basic Metabolic Panel (w/Total Calcium)  Glucose, Serum 162  BUN 99  Creatinine (comp) 5.59  Sodium, Serum 139  Potassium, Serum 5.0  Chloride, Serum 104  CO2, Serum 19  Calcium (Total), Serum 8.4  Anion Gap 16  Osmolality (calc) 312  eGFR (African American) 9  eGFR (Non-African American) 8  eGFR values <677mmin/1.73 m2 may be an indication of chronic  kidney disease (CKD).  Calculated eGFR, using the MRDR Study equation, is useful in   patients with stable renal function.  The eGFR calculation will not be reliable in acutely ill patients  when serum creatinine is changing rapidly. It is not useful in  patients on dialysis. The  eGFR calculation may not be applicable  to patients at the low and high extremes of body sizes, pregnant  women, and vegetarians.    06-Dec-15 06:02, Hemoglobin A1c ([facility])  Hemoglobin A1c ([facility]) 7.1  The American Diabetes Association recommends that a primary goal of  therapy should be <7% and that physicians should reevaluate the  treatment regimen in patients with HbA1c values consistently >8%.    06-Dec-15 06:02, [facility] Cholesterol, Serum 146  Triglycerides, Serum 55  HDL (INHOUSE) 65  VLDL Cholesterol Calculated 11  LDL Cholesterol Calculated 70  Result(s) reported on 16 Oct 2014 at 06:29AM.    06-Dec-15 06:02, Magnesium, Serum  Magnesium, Serum 2.2  1.8-2.4  THERAPEUTIC RANGE: 4-7 mg/dL  TOXIC: >  10 mg/dL   -----------------------    07-Dec-15 91:69, Basic Metabolic Panel (w/Total Calcium)  Glucose, Serum 138  BUN 109  Creatinine (comp) 6.39  Sodium, Serum 137  Potassium, Serum 4.7  Chloride, Serum 106  CO2, Serum 18  Calcium (Total), Serum 8.3  Anion Gap 13  Osmolality (calc) 310  eGFR (African American) 8  eGFR (Non-African American) 6  eGFR values <38m/min/1.73 m2 may be an indication of chronic  kidney disease (CKD).  Calculated eGFR, using the MRDR Study equation, is useful in   patients with stable renal function.  The eGFR calculation will not be reliable in acutely ill patients  when serum creatinine is changing rapidly. It is not useful in  patients on dialysis. The eGFR calculation may not be applicable  to patients at the low and high extremes of body sizes, pregnant  women, and vegetarians.  Cardiac:    05-Dec-15 05:36, CPK-MB, Serum  CPK-MB, Serum 8.7  Result(s) reported on 15 Oct 2014 at 06:36AM.    05-Dec-15 05:36, Troponin I  Troponin I 3.40  0.00-0.05  0.05 ng/mL or less: NEGATIVE   Repeat testing in 3-6 hrs   if clinically indicated.  >0.05 ng/mL: POTENTIAL   MYOCARDIAL INJURY. Repeat   testing in 3-6 hrs if   clinically  indicated.  NOTE: An increase or decrease   of 30% or more on serial   testing suggests a   clinically important change    05-Dec-15 09:32, CPK-MB, Serum  CPK-MB, Serum 7.8  Result(s) reported on 15 Oct 2014 at 10:11AM.    05-Dec-15 09:32, Troponin I  Troponin I 3.00  0.00-0.05  0.05 ng/mL or less: NEGATIVE   Repeat testing in 3-6 hrs   if clinically indicated.  >0.05 ng/mL: POTENTIAL   MYOCARDIAL INJURY. Repeat   testing in 3-6 hrs if   clinically indicated.  NOTE: An increase or decrease   of 30% or more on serial   testing suggests a   clinically important change    05-Dec-15 13:33, CPK-MB, Serum  CPK-MB, Serum 7.3  Result(s) reported on 15 Oct 2014 at 02:13PM.    05-Dec-15 13:33, Troponin I  Troponin I 3.10  0.00-0.05  0.05 ng/mL or less: NEGATIVE   Repeat testing in 3-6 hrs   if clinically indicated.  >0.05 ng/mL: POTENTIAL   MYOCARDIAL INJURY. Repeat   testing in 3-6 hrs if   clinically indicated.  NOTE: An increase or decrease   of 30% or more on serial   testing suggests a   clinically important change  Routine Coag:    05-Dec-15 05:36, Activated PTT  Activated PTT (APTT) 41.7  A HCT value >55% may artifactually increase the APTT. In one study,  the increase was an average of 19%.  Reference: "Effect on Routine and Special Coagulation Testing Values  of Citrate Anticoagulant Adjustment in Patients with High HCT Values."  American Journal of Clinical Pathology 2006;126:400-405.    05-Dec-15 05:36, Prothrombin Time  Prothrombin 14.3  INR 1.1  INR reference interval applies to patients on anticoagulant therapy.  A single INR therapeutic range for coumarins is not optimal for all  indications; however, the suggested range for most indications is  2.0 - 3.0.  Exceptions to the INR Reference Range may include: Prosthetic heart  valves, acute myocardial infarction, prevention of myocardial  infarction, and combinations of aspirin and anticoagulant. The  need  for a higher or lower target INR must be assessed individually.  Reference: The Pharmacology and Management  of the Vitamin K   antagonists: the seventh ACCP Conference on Antithrombotic and  Thrombolytic Therapy. OFBPZ.0258 Sept:126 (3suppl): [id number].  A HCT value >55% may artifactually increase the PT.  In one study,   the increase was an average of 25%.  Reference:  "Effect on Routine and Special Coagulation Testing Values  of Citrate Anticoagulant Adjustment in Patients with High HCT Values."  American Journal of Clinical Pathology 2006;126:400-405.  Routine Hem:    05-Dec-15 05:36, Hemogram, Platelet Count  WBC (CBC) 8.6  RBC (CBC) 3.77  Hemoglobin (CBC) 10.0  Hematocrit (CBC) 31.2  Platelet Count (CBC) 180  Result(s) reported on 15 Oct 2014 at 05:46AM.  MCV 83  MCH 26.7  MCHC 32.2  RDW 16.4    06-Dec-15 06:02, CBC Profile  WBC (CBC) 8.3  RBC (CBC) 3.41  Hemoglobin (CBC) 9.0  Hematocrit (CBC) 28.2  Platelet Count (CBC) 170  MCV 83  MCH 26.5  MCHC 32.0  RDW 16.6  Neutrophil % 89.7  Lymphocyte % 6.2  Monocyte % 3.4  Eosinophil % 0.1  Basophil % 0.6  Neutrophil # 7.5  Lymphocyte # 0.5  Monocyte # 0.3  Eosinophil # 0.0  Basophil # 0.0  Result(s) reported on 16 Oct 2014 at 06:26AM.    07-Dec-15 05:22, CBC Profile  WBC (CBC) 7.6  RBC (CBC) 3.26  Hemoglobin (CBC) 8.6  Hematocrit (CBC) 27.1  Platelet Count (CBC) 166  MCV 83  MCH 26.5  MCHC 31.9  RDW 16.5  Neutrophil % 75.5  Lymphocyte % 13.4  Monocyte % 9.5  Eosinophil % 0.9  Basophil % 0.7  Neutrophil # 5.8  Lymphocyte # 1.0  Monocyte # 0.7  Eosinophil # 0.1  Basophil # 0.1  Result(s) reported on 17 Oct 2014 at 06:01AM.   RADIOLOGY:  Radiology Results: XRay:    14-May-15 21:56, KUB - Kidney Ureter Bladder  KUB - Kidney Ureter Bladder  REASON FOR EXAM:    constipation  COMMENTS:       PROCEDURE: DXR - DXR KIDNEY URETER BLADDER  - Mar 24 2014  9:56PM     CLINICAL DATA:   Constipation    EXAM:  ABDOMEN - 1 VIEW    COMPARISON:  09/08/2013    FINDINGS:  Scattered gas and stool in the colon. No small or large bowel  distention. Prominent vascular calcifications. Radiopaque foreign  bodies projected over the pelvis. Compression of the L3 vertebra.  This was present on prior studies. Degenerative changes in the  lumbar spine and hips.     IMPRESSION:  Nonobstructive bowel gas pattern.      Electronically Signed    By: Lucienne Capers M.D.    On: 03/24/2014 22:03         Verified By: Neale Burly, M.D.,  [place]:    15-May-15 09:47, US Kidney Bilateral  US Kidney Bilateral  REASON FOR EXAM:    AKI  COMMENTS:       PROCEDURE: [place]  - US KIDNEY  - Mar 25 2014  9:47AM     CLINICAL DATA:  Acute kidney injury.    EXAM:  RENAL/URINARY TRACT ULTRASOUND COMPLETE    COMPARISON:  None.    FINDINGS:  Right Kidney:  Length: 6 cm. Increased echogenicity is noted of the renal  parenchyma. No mass or hydronephrosis visualized.    Left Kidney:    Length: 8.8 cm. Echogenicity within normal limits. No mass or  hydronephrosis visualized.    Bladder:    Moderate distention  of urinary bladder is noted.     IMPRESSION:  Severe right renal atrophy is noted. Left kidney appears normal. No  hydronephrosis or renal obstruction is noted. Moderately distended  urinary bladder.      Electronically Signed    By: Sabino Dick M.D.    On: 03/25/2014 09:49         Verified By: Marveen Reeks, M.D.,    05-Dec-15 15:30, US Kidney Bilateral  US Kidney Bilateral  REASON FOR EXAM:    ARF/CKD  COMMENTS:       PROCEDURE: [place]  - US KIDNEY  - Oct 15 2014  3:30PM     CLINICAL DATA:  Chronic renal disease with acute renal failure,  initial evaluation, personal history of severe right renal atrophy    EXAM:  RENAL/URINARY TRACT ULTRASOUND COMPLETE    COMPARISON:  03/25/2014    FINDINGS:  Right Kidney:  Length: 5.4 cm.  No focal abnormalities    Left  Kidney:    Length: 7.9 cm. Mildly increased echogenicity. No focal  abnormalities.    Bladder:    Appears normal fordegree of bladder distention.     IMPRESSION:  As previously noted there is severe asymmetric right renal atrophy.  Left kidney shows mildly increased echogenicity suggesting chronic  medical renal disease.  Electronically Signed    By: Skipper Cliche M.D.    On: 10/15/2014 17:32         Verified By: Rachael Fee, M.D.,  [place]:    13-Mar-15 21:29, CT Cervical Spine Without Contrast  PACS Image    13-Mar-15 21:29, CT Head Without Contrast  PACS Image    14-May-15 21:56, KUB - Kidney Ureter Bladder  PACS Image    15-May-15 09:47, US Kidney Bilateral  PACS Image    05-Dec-15 15:30, US Kidney Bilateral  PACS Image  CT:    13-Mar-15 21:29, CT Cervical Spine Without Contrast  CT Cervical Spine Without Contrast  REASON FOR EXAM:    fall, dementia  COMMENTS:       PROCEDURE: CT  - CT CERVICAL SPINE WO  - Jan 21 2014  9:29PM     CLINICAL DATA:  Fall    EXAM:  CT HEAD WITHOUT CONTRAST    CT CERVICAL SPINE WITHOUT CONTRAST    TECHNIQUE:  Multidetector CT imagingof the head and cervical spine was  performed following the standard protocol without intravenous  contrast. Multiplanar CT image reconstructions of the cervical spine  were also generated.    COMPARISON:  None.    FINDINGS:  CT HEAD FINDINGS    Generalized atrophy with moderate chronic microvascular ischemic  change throughout the white matter. Chronic lacunar infarction in  the right medial thalamus. No acute infarct. Negative for hemorrhage  mass or skull fracture.    CT CERVICAL SPINE FINDINGS  Normal cervical alignment. Moderate to advanced degenerative  changes. There is extensive facet degeneration bilaterally.  Multilevel spondylosis throughout the cervical spine, most severe at  C5-6 and C6-7.    Negative for fracture.    Extensive carotid atherosclerotic disease and  likely significant  carotid stenosis bilaterally.     IMPRESSION:  Atrophy and chronic microvascular ischemia. No acute intracranial  abnormality.    Moderate to advanced cervical degenerative change. Negative for  fracture.      Electronically Signed    By: Franchot Gallo M.D.    On: 01/21/2014 21:34         Verified By: DAVID C.  Carlis Abbott, M.D.,    13-Mar-15 21:29, CT Head Without Contrast  CT Head Without Contrast  REASON FOR EXAM:    fall, dementia  COMMENTS:       PROCEDURE: CT  - CT HEAD WITHOUT CONTRAST  - Jan 21 2014  9:29PM     CLINICAL DATA:  Fall    EXAM:  CT HEAD WITHOUT CONTRAST    CT CERVICAL SPINE WITHOUT CONTRAST    TECHNIQUE:  Multidetector CT imaging of the head and cervical spine was  performed following the standard protocol without intravenous  contrast. Multiplanar CT image reconstructions of the cervical spine  were also generated.    COMPARISON:  None.    FINDINGS:  CT HEAD FINDINGS    Generalized atrophy with moderate chronic microvascular ischemic  change throughout the white matter. Chronic lacunar infarction in  the right medial thalamus. No acute infarct. Negative for hemorrhage  mass or skull fracture.    CT CERVICAL SPINE FINDINGS  Normal cervical alignment. Moderate to advanced degenerative  changes. There is extensive facet degeneration bilaterally.  Multilevel spondylosis throughout the cervical spine, most severe at  C5-6 and C6-7.    Negative for fracture.    Extensive carotid atherosclerotic disease and likely significant  carotid stenosis bilaterally.     IMPRESSION:  Atrophy and chronic microvascular ischemia. No acute intracranial  abnormality.    Moderate to advanced cervical degenerative change. Negative for  fracture.      Electronically Signed    By: Franchot Gallo M.D.    On: 01/21/2014 21:34         Verified By: Truett Perna, M.D.,   ASSESSMENT AND PLAN:  Assessment/Admission Diagnosis renal  failure, now felt to be ESRD dementia severe HTN   Plan Family desires aggressive therapy and wants dialysis.  As such, we are asked to place a Permcath.  This was placed without difficulty.  OK to use for dialysis.  If she tolerates dialysis for several weeks, can consider outpatient work up for a more permanent dialysis access option.      level 4 consult   Electronic Signatures: Algernon Huxley (MD)  (Signed 07-Dec-15 15:52)  Authored: Chief Complaint and History, PAST MEDICAL/SURGICAL HISTORY, ALLERGIES, HOME MEDICATIONS, Family and Social History, Review of Systems, Physical Exam, LABS, RADIOLOGY, Assessment and Plan   Last Updated: 07-Dec-15 15:52 by Algernon Huxley (MD)

## 2015-03-04 NOTE — Discharge Summary (Signed)
PATIENT NAME:  Caroline Flores, Caroline Flores MR#:  [id number] DATE OF BIRTH:  08/24/1920  INTERIM DISCHARGE SUMMARY   PRIMARY CARE PHYSICIAN: Angus PalmsSionne George, MD  REASON FOR ADMISSION: High blood pressure.   HOSPITAL COURSE: The patient is a 81 year old African American female with a history of hypertension, diabetes, CKD, sent from nursing home due to high blood pressure. The patient's blood pressure was high at 240/112. In addition, the patient was noted to have elevated troponin and creatinine. She was started with heparin drip in ED. For detailed history and physical examination, please refer to the admission note dictated by me. On admission date, the patient's troponin level was 3.4. Second troponin level was 3.0. Hemoglobin 10, WBC 8.6, BUN 88, creatinine 5.44, INR 1.1.  1.  Acute renal failure on chronic kidney disease. After admission, the patient initially was treated with IV fluid support; however, the patient's renal function worsened and progressed to ESRD.  Dr. Wynelle LinkKolluru suggested the patient needed hemodialysis. The patient got dialysis catheter by Dr. Wyn Quakerew and started hemodialysis.  2.  Elevated troponin is possibly due to acute renal failure. The patient was treated with heparin drip and aspirin and Plavix. Dr. Lady GaryFath suggested elevated troponin is probably due to acute renal failure. He suggested to discontinue heparin drip and no further invasive workup.  3.  Malignant hypertension. The patient has been treated with multiple hypertension medications. The patient's blood pressure continues to be elevated more than 180. The patient has been treated with multiple hypertension medication with hydralazine, nifedipine, and lisinopril. The patient also got continuous hemodialysis. The last blood pressure today decreased to 156/64.  4.  Diabetes. Has been treated with sliding scale. Blood sugar is controlled.  5.  Anemia of chronic disease, stable.  6.  The patient is demented. If the patient's blood pressure  is controlled, she will be discharged  back to nursing home within 1-2 days.  CURRENT DIAGNOSES:  1.  Acute renal failure on chronic kidney disease progressed to end-stage renal disease.  2.  Elevated troponin due to acute renal failure.  3.  Malignant hypertension.  4.  Diabetes.  5.  Dementia.  6.  Anemia of chronic disease.    ____________________________ Shaune PollackQing Braylinn Gulden, MD qc:LT D: 10/23/2014 11:26:11 ET T: 10/23/2014 17:22:57 ET JOB#: [id number]  cc: Shaune PollackQing Mora Pedraza, MD, <Dictator> Shaune PollackQING Esdras Delair MD ELECTRONICALLY SIGNED 10/25/2014 10:41

## 2015-03-04 NOTE — H&P (Signed)
PATIENT NAME:  Caroline Flores, Dhana F MR#:  [id number] DATE OF BIRTH:  May 30, 1920  DATE OF ADMISSION:  03/24/2014  PRIMARY CARE PHYSICIAN:    REFERRING PHYSICIAN: Daryel NovemberJonathan Williams.   CHIEF COMPLAINT: Abnormal labs with acute kidney injury.   HISTORY OF PRESENT ILLNESS:  This is a very nice 81 year old female with history of dementia, hypertension, diabetes, who has history of being in a nursing home for a while, recently discharged 03/16/2014 to move into a family member's house. Since then, she has been doing very well and the family does not have any concerns.  The niece called the primary care physician to get some refills on the medications, and they told them on Monday to come and get some lab work before getting those refills.  The lab work was done, and today she was called saying that her kidneys were failing and that she needed to be seen in the hospital. Here, her creatinine is 3.91. Her previous creatinine, on 01/21/2014, was 1.54, and prior to that, in 08/2013, it was 1.73.   The patient was admitted for treatment and evaluation of kidney failure.  After interview with the patient, the patient is demented and she is actually very uncooperative right now. The niece is here with her, and she tells me that she has been eating and drinking very well. Does not have any significant weight gain or weight loss. She has some edema of the lower extremities. No fever. No chills. No infections. The patient has been doing remarkably well. She walks around with a walker without a problem. The only main problem is that she has been severely constipated. She only had a couple bowel movements since she has been at her house.  The patient is admitted for evaluation of acute kidney injury.   Review of systems: Unable to obtain due to the patient's dementia and not being cooperative.   PAST MEDICAL HISTORY: 1.  Hypertension.  2.  Dementia.  3.  Type 2 diabetes.  4.  Chronic kidney disease stage III to IV.   5.  Anemia of chronic disease   ALLERGIES: No known drug allergies.   CURRENT MEDICATIONS: Aspirin 81 mg 1 daily, benazepril 40 mg once daily, Maalox plus as needed for heartburn, Milk of Magnesia as needed for constipation, isosorbide mononitrate 120 mg once daily, Mirtazapine 50 mg once daily, sertraline 50 mg once a day, trazodone 50 mg at bedtime, loperamide designated for diarrhea, Allegra as needed for allergies; alprazolam 0.25 mg once a day as needed for anxiety, metoprolol 25 mg twice daily, amlodipine 10 mg once daily, Aricept 5 mg once daily, furosemide 20 mg once daily, Robitussin as needed for cough, nasal spray fluticasone for allergies, melatonin 1 mg once a day, hydralazine 50 mg 3 times a day.   SOCIAL HISTORY: The patient does not smoke, does not drink, does not use drugs. Now she is living with a family member, niece.   FAMILY HISTORY: A strong history of hypertension in the family. Other than that, no strokes. No CVAs, No cancer   PAST SURGICAL HISTORY: Negative.   PHYSICAL EXAMINATION:  GENERAL:  The patient is alert, oriented x3.  Well nourished, well hydrated.  No distress, but uncooperative.  VITAL SIGNS: Her blood pressure is 142/78, pulse 51, respirations 16, temperature 97.8, oxygen saturation 100% on room air.  HEENT:  Pupils are equal and reactive. Extraocular movements are intact. Mucosae are moist. Anicteric sclerae. Pink conjunctivae. No oral lesions. No oropharyngeal exudates. Normocephalic, atraumatic.  NECK:  Supple. No JVD. No thyromegaly. No adenopathy. No carotid bruits. No rigidity.  CARDIOVASCULAR: More normal at a regular rate and rhythm. No murmurs, rubs or gallops. No displacement of PMI. No tenderness to palpation of anterior chest wall.  LUNGS: Clear without any wheezing or crepitus. No use of accessory muscles.  ABDOMEN: Soft, nontender, nondistended. No hepatosplenomegaly. No masses. Bowel sounds are positive.   EXTREMITIES: Positive edema +2, which  is chronic, bilaterally.  VASCULAR: Pulses +2.  Capillary refill less than three.  SKIN: No rashes, petechiae, or new lesions.  MUSCULOSKELETAL: No significant joint swelling or edema. Normal range of motion for age.  NEUROLOGIC: Cranial nerves II through XII intact. Strength is 5/5 in all four extremities. Moves four extremities. Deep tendon reflexes +2. The patient is alert and oriented mostly to person.  PSYCHIATRIC: Slightly agitated due to nurse trying to obtain an IV access.  LYMPHATIC: Negative for lymphadenopathy in the neck or supraclavicular areas.    LABORATORY DATA:  Normal urinalysis. No signs of urinary infection.  Negative white blood cells and red blood cells. Her white count is 4.4, hemoglobin is 9.9, platelet count is 153. Normocytic anemia. Troponin is negative.  Glucose is 153, BUN 68, creatinine is 2.91, sodium 138, potassium 4.9. GFR is below 9.   ASSESSMENT AND PLAN: A very nice 81 year old female with history of dementia who comes with acute-on-chronic kidney injury.   1.  Acute kidney injury. The patient has chronic kidney disease stage IV, this is an increase from her baseline. Her baseline is usually about 1.5 to 2. The patient does not have any signs of hemodynamic instability.  The  patient does not have any signs of an acute infection. The patient does not look dehydrated. Actually she does have some edema of the lower extremities. We are going to admit her and give her a fluid challenge with a small amount of fluids, only 90 mL an hour due to the edema. The patient does not have history of congestive heart failure, although the patient seems to have history of coronary artery disease with Imdur and metoprolol as medications. She takes an aspirin but never had history of pulmonary edema. We are going to continue to monitor and get an ultrasound of the kidneys to evaluate size and also to evaluate possible hydronephrosis. Since the patient has been really constipated  recently, cannot rule out the possibility of an impaction that is compressing the bladder and creating this acute injury. Nephrology consultation to be obtained. Hold Lasix.  Hold benazepril and any NSAIDs. Avoid any nephrotoxins.  2.  Hypertension. Continue treatment of hypertension with home medications. At this moment, blood pressure seems to be stable.  3.  Coronary artery disease. Continue Imdur, metoprolol and aspirin.  4.  Dementia. Continue treatment with Aricept.  5.  Diabetes.  Insulin sliding scale.  Avoid nephrotoxins like metformin.  The patient used to take metformin but she no longer has this.  6.  The patient is a full code.  7.  Deep vein thrombosis prophylaxis.  We are going to use heparin.  8.  Gastrointestinal prophylaxis with Protonix.   I spent about 45 minutes with this admission    ____________________________ Felipa Furnace, MD rsg:jb D: 03/24/2014 21:47:49 ET T: 03/24/2014 22:08:35 ET JOB#: [id number]  cc: Felipa Furnace, MD, <Dictator> Olufemi Mofield Juanda Chance MD ELECTRONICALLY SIGNED 04/01/2014 22:40

## 2015-03-04 NOTE — Consult Note (Signed)
Present Illness Asked to see pateint in consultation regarding abnormal serum troponin. Pt is a 81 yo female with history of dementia, diabetes mellitus, type ii, hyppertension, chronic kidney disease treated with amlodipine 5 mg daily, asa 81 mg daily, clonidine 0.3 mg bid, lasix 20 mg daily, hydralazine 50 mg tid; imdur 120 mg daily, remeron and aricept and sertraline as outpatient. She was brought to the er for evaluation and was noted to have elevated serum troponin to 3.5 with serum creatinine of 5.44. Echo done in 5/15 revealed ef of 55%, moderate as with ava of 1.6 cm2. Her most recent outpatient serum creatinine was 4.2 in 03/22/14. Pt is a very difficult historian but denies chest pain or sob. EKG reveals. EKG was normal with no ischemic changes.   Physical Exam:  GEN no acute distress   HEENT hearing intact to voice   NECK supple   RESP normal resp effort  clear BS   CARD Regular rate and rhythm  Normal, S1, S2  Murmur   Murmur Systolic   Systolic Murmur Out flow   ABD denies tenderness  normal BS   LYMPH negative neck   EXTR negative cyanosis/clubbing, negative edema   SKIN normal to palpation   NEURO cranial nerves intact   PSYCH alert, poor insight   Review of Systems:  Subjective/Chief Complaint no complaints   General: No Complaints   Skin: No Complaints   ENT: No Complaints   Eyes: No Complaints   Neck: No Complaints   Respiratory: No Complaints   Cardiovascular: No Complaints   Gastrointestinal: No Complaints   Genitourinary: No Complaints   Vascular: No Complaints   Musculoskeletal: No Complaints   Neurologic: No Complaints   Hematologic: No Complaints   Endocrine: No Complaints   Psychiatric: No Complaints   ROS Pt not able to provide ROS   Medications/Allergies Reviewed Medications/Allergies reviewed   Family & Social History:  Family and Social History:  Family History Non-Contributory   EKG:  EKG NSR   Abnormal NSSTTW  changes    Shellfish: Unknown   Impression 81 yo female with history of dementia, ckd iv with creatinine in 5/15 of 4.2, history of dm, hypertension who was admitted after presenting to the er. Pt is not sure why she is here. She denies chest pain or sob. Creatinine was 5.44 and serum troponin was 3.4. EKG was normal. Had normal lv funciton with mild to mod aortic stenosis by echo as outpatient in 5/15. She does not appear to have acute coronary syndrome by ekg or symptoms. Elevated troponin is likely secndary to her renal failure. She is not a candidate for invasive or noninvasive ischemic workup based on comorbid conditions. Would continue with asa at 81 mg; oral nitrates at 120 mg daily with the addition of topical nitrates for bp control; clonidine 0.3 bid; atorvastatin at current dose. Pt has history of bradycardia with beta blockers in the past so will decrease metoprolol to 12.5 mg bid and follow heart rate. WOuld stop heparin in 24 hours. Agree with nephrology evaluation regarding worsening renal funciton. Event is likely not acute coronary syndrome but demand ischemia in face of acute on chronic renal failure   Plan 1. Continue with heparin for no more than 24 hours 2. Conitnue asa at 81 mg daily; imdur 120 mg daily; metoprolol 12.5 bid; topical nitrates as needed; clonidine 0.3 mg dialy; atorvastatin at 20 mg daily;clopidigrel 75 mg dialy 3. Proceed with nephrology evaluation regarding worsening renal funciton. 4.  Further recs pending course.   Electronic Signatures: Dalia Heading (MD)  (Signed 05-Dec-15 09:53)  Authored: General Aspect/Present Illness, History and Physical Exam, Review of System, Family & Social History, EKG , Allergies, Impression/Plan   Last Updated: 05-Dec-15 09:53 by Dalia Heading (MD)

## 2015-06-19 ENCOUNTER — Encounter: Payer: Self-pay | Admitting: Emergency Medicine

## 2015-06-19 ENCOUNTER — Inpatient Hospital Stay
Admission: EM | Admit: 2015-06-19 | Discharge: 2015-06-28 | DRG: 640 | Disposition: A | Discharge status: Skilled Nursing Facility | Payer: Medicare Other | Attending: Internal Medicine | Admitting: Internal Medicine

## 2015-06-19 ENCOUNTER — Emergency Department: Payer: Medicare Other

## 2015-06-19 DIAGNOSIS — Z992 Dependence on renal dialysis: Secondary | ICD-10-CM | POA: Diagnosis not present

## 2015-06-19 DIAGNOSIS — D631 Anemia in chronic kidney disease: Secondary | ICD-10-CM | POA: Diagnosis present

## 2015-06-19 DIAGNOSIS — R7989 Other specified abnormal findings of blood chemistry: Secondary | ICD-10-CM | POA: Diagnosis present

## 2015-06-19 DIAGNOSIS — E875 Hyperkalemia: Secondary | ICD-10-CM | POA: Diagnosis present

## 2015-06-19 DIAGNOSIS — G9349 Other encephalopathy: Secondary | ICD-10-CM | POA: Diagnosis present

## 2015-06-19 DIAGNOSIS — E118 Type 2 diabetes mellitus with unspecified complications: Secondary | ICD-10-CM | POA: Diagnosis present

## 2015-06-19 DIAGNOSIS — H9190 Unspecified hearing loss, unspecified ear: Secondary | ICD-10-CM | POA: Diagnosis present

## 2015-06-19 DIAGNOSIS — M549 Dorsalgia, unspecified: Secondary | ICD-10-CM | POA: Diagnosis present

## 2015-06-19 DIAGNOSIS — R079 Chest pain, unspecified: Secondary | ICD-10-CM | POA: Diagnosis not present

## 2015-06-19 DIAGNOSIS — M791 Myalgia: Secondary | ICD-10-CM | POA: Diagnosis present

## 2015-06-19 DIAGNOSIS — Z9115 Patient's noncompliance with renal dialysis: Secondary | ICD-10-CM | POA: Diagnosis present

## 2015-06-19 DIAGNOSIS — I120 Hypertensive chronic kidney disease with stage 5 chronic kidney disease or end stage renal disease: Secondary | ICD-10-CM | POA: Diagnosis present

## 2015-06-19 DIAGNOSIS — Z7982 Long term (current) use of aspirin: Secondary | ICD-10-CM | POA: Diagnosis not present

## 2015-06-19 DIAGNOSIS — F17210 Nicotine dependence, cigarettes, uncomplicated: Secondary | ICD-10-CM | POA: Diagnosis present

## 2015-06-19 DIAGNOSIS — N185 Chronic kidney disease, stage 5: Secondary | ICD-10-CM | POA: Diagnosis not present

## 2015-06-19 DIAGNOSIS — F0391 Unspecified dementia with behavioral disturbance: Secondary | ICD-10-CM | POA: Diagnosis present

## 2015-06-19 DIAGNOSIS — N186 End stage renal disease: Secondary | ICD-10-CM

## 2015-06-19 DIAGNOSIS — I350 Nonrheumatic aortic (valve) stenosis: Secondary | ICD-10-CM | POA: Diagnosis present

## 2015-06-19 DIAGNOSIS — K5900 Constipation, unspecified: Secondary | ICD-10-CM | POA: Diagnosis present

## 2015-06-19 DIAGNOSIS — R778 Other specified abnormalities of plasma proteins: Secondary | ICD-10-CM | POA: Diagnosis present

## 2015-06-19 DIAGNOSIS — F0390 Unspecified dementia without behavioral disturbance: Secondary | ICD-10-CM | POA: Diagnosis present

## 2015-06-19 DIAGNOSIS — I10 Essential (primary) hypertension: Secondary | ICD-10-CM | POA: Diagnosis not present

## 2015-06-19 DIAGNOSIS — R0789 Other chest pain: Secondary | ICD-10-CM | POA: Diagnosis present

## 2015-06-19 DIAGNOSIS — E8809 Other disorders of plasma-protein metabolism, not elsewhere classified: Secondary | ICD-10-CM | POA: Diagnosis present

## 2015-06-19 DIAGNOSIS — E119 Type 2 diabetes mellitus without complications: Secondary | ICD-10-CM

## 2015-06-19 DIAGNOSIS — I342 Nonrheumatic mitral (valve) stenosis: Secondary | ICD-10-CM | POA: Diagnosis not present

## 2015-06-19 HISTORY — DX: Unspecified dementia, unspecified severity, without behavioral disturbance, psychotic disturbance, mood disturbance, and anxiety: F03.90

## 2015-06-19 HISTORY — DX: Essential (primary) hypertension: I10

## 2015-06-19 HISTORY — DX: Chronic kidney disease, stage 5: N18.5

## 2015-06-19 HISTORY — DX: Type 2 diabetes mellitus without complications: E11.9

## 2015-06-19 NOTE — ED Notes (Addendum)
Pt presents to ED from [facility] c/o generalize body ache, mainly abdomen, chest and back, onset x2 days. Pt alerts and oriented x3. Pt on dialysis on Tuesdays, Thursday, Saturdays. Pt has not done dialysis for a week. Daughter states due to family situation she is not able to sit with pt at dialysis. Hx of dementia.

## 2015-06-20 ENCOUNTER — Inpatient Hospital Stay (HOSPITAL_COMMUNITY)
Admit: 2015-06-20 | Discharge: 2015-06-20 | Disposition: A | Discharge status: Home / Self Care | Payer: Medicare Other | Attending: Cardiovascular Disease | Admitting: Cardiovascular Disease

## 2015-06-20 ENCOUNTER — Encounter: Payer: Self-pay | Admitting: Internal Medicine

## 2015-06-20 DIAGNOSIS — I342 Nonrheumatic mitral (valve) stenosis: Secondary | ICD-10-CM

## 2015-06-20 DIAGNOSIS — E1169 Type 2 diabetes mellitus with other specified complication: Secondary | ICD-10-CM

## 2015-06-20 DIAGNOSIS — I10 Essential (primary) hypertension: Secondary | ICD-10-CM | POA: Diagnosis present

## 2015-06-20 DIAGNOSIS — N185 Chronic kidney disease, stage 5: Secondary | ICD-10-CM

## 2015-06-20 DIAGNOSIS — R7989 Other specified abnormal findings of blood chemistry: Secondary | ICD-10-CM

## 2015-06-20 DIAGNOSIS — F0390 Unspecified dementia without behavioral disturbance: Secondary | ICD-10-CM | POA: Diagnosis present

## 2015-06-20 DIAGNOSIS — R079 Chest pain, unspecified: Secondary | ICD-10-CM

## 2015-06-20 DIAGNOSIS — E119 Type 2 diabetes mellitus without complications: Secondary | ICD-10-CM

## 2015-06-20 DIAGNOSIS — R778 Other specified abnormalities of plasma proteins: Secondary | ICD-10-CM | POA: Diagnosis present

## 2015-06-20 DIAGNOSIS — Z992 Dependence on renal dialysis: Secondary | ICD-10-CM

## 2015-06-20 DIAGNOSIS — N186 End stage renal disease: Secondary | ICD-10-CM

## 2015-06-20 DIAGNOSIS — E875 Hyperkalemia: Secondary | ICD-10-CM | POA: Diagnosis present

## 2015-06-20 LAB — COMPREHENSIVE METABOLIC PANEL
ALBUMIN: 3.2 g/dL — AB (ref 3.5–5.0)
ALK PHOS: 50 U/L (ref 38–126)
ALT: 10 U/L — AB (ref 14–54)
AST: 18 U/L (ref 15–41)
Anion gap: 22 — ABNORMAL HIGH (ref 5–15)
BUN: 97 mg/dL — AB (ref 6–20)
CHLORIDE: 99 mmol/L — AB (ref 101–111)
CO2: 17 mmol/L — ABNORMAL LOW (ref 22–32)
Calcium: 8.7 mg/dL — ABNORMAL LOW (ref 8.9–10.3)
Creatinine, Ser: 16.86 mg/dL — ABNORMAL HIGH (ref 0.44–1.00)
GFR, EST AFRICAN AMERICAN: 2 mL/min — AB (ref >60)
GFR, EST NON AFRICAN AMERICAN: 2 mL/min — AB (ref >60)
Glucose, Bld: 113 mg/dL — ABNORMAL HIGH (ref 65–99)
Potassium: 6 mmol/L — ABNORMAL HIGH (ref 3.5–5.1)
SODIUM: 138 mmol/L (ref 135–145)
Total Bilirubin: 0.8 mg/dL (ref 0.3–1.2)
Total Protein: 6.5 g/dL (ref 6.5–8.1)

## 2015-06-20 LAB — TROPONIN I
TROPONIN I: 0.09 ng/mL — AB (ref <0.031)
TROPONIN I: 0.09 ng/mL — AB (ref <0.031)
Troponin I: 0.08 ng/mL — ABNORMAL HIGH (ref <0.031)

## 2015-06-20 LAB — CBC
HEMATOCRIT: 31.3 % — AB (ref 35.0–47.0)
HEMOGLOBIN: 10.1 g/dL — AB (ref 12.0–16.0)
MCH: 28.7 pg (ref 26.0–34.0)
MCHC: 32.2 g/dL (ref 32.0–36.0)
MCV: 89.1 fL (ref 80.0–100.0)
Platelets: 172 10*3/uL (ref 150–440)
RBC: 3.51 MIL/uL — AB (ref 3.80–5.20)
RDW: 16.7 % — ABNORMAL HIGH (ref 11.5–14.5)
WBC: 9 10*3/uL (ref 3.6–11.0)

## 2015-06-20 LAB — GLUCOSE, CAPILLARY
GLUCOSE-CAPILLARY: 73 mg/dL (ref 65–99)
Glucose-Capillary: 96 mg/dL (ref 65–99)
Glucose-Capillary: 84 mg/dL (ref 65–99)
Glucose-Capillary: 97 mg/dL (ref 65–99)
Glucose-Capillary: 96 mg/dL (ref 65–99)

## 2015-06-20 LAB — BASIC METABOLIC PANEL
Anion gap: 20 — ABNORMAL HIGH (ref 5–15)
BUN: 120 mg/dL — ABNORMAL HIGH (ref 6–20)
CO2: 20 mmol/L — ABNORMAL LOW (ref 22–32)
CREATININE: 17.32 mg/dL — AB (ref 0.44–1.00)
Calcium: 9.1 mg/dL (ref 8.9–10.3)
Chloride: 99 mmol/L — ABNORMAL LOW (ref 101–111)
GFR calc Af Amer: 2 mL/min — ABNORMAL LOW (ref >60)
GFR calc non Af Amer: 2 mL/min — ABNORMAL LOW (ref >60)
Glucose, Bld: 89 mg/dL (ref 65–99)
Potassium: 4.9 mmol/L (ref 3.5–5.1)
Sodium: 139 mmol/L (ref 135–145)

## 2015-06-20 LAB — MRSA PCR SCREENING: MRSA by PCR: NEGATIVE

## 2015-06-20 MED ORDER — DEXTROSE 50 % IV SOLN
1.0000 | Freq: Once | INTRAVENOUS | Status: AC
  Administered 2015-06-20: 50 mL via INTRAVENOUS
  Filled 2015-06-20: qty 50

## 2015-06-20 MED ORDER — ACETAMINOPHEN 325 MG PO TABS
650.0000 mg | ORAL_TABLET | Freq: Four times a day (QID) | ORAL | Status: DC | PRN
Start: 2015-06-20 — End: 2015-06-28

## 2015-06-20 MED ORDER — DONEPEZIL HCL 5 MG PO TABS
5.0000 mg | ORAL_TABLET | Freq: Every day | ORAL | Status: DC
  Administered 2015-06-20 – 2015-06-27 (×6): 5 mg via ORAL
  Filled 2015-06-20 (×8): qty 1

## 2015-06-20 MED ORDER — METOPROLOL TARTRATE 25 MG PO TABS
25.0000 mg | ORAL_TABLET | Freq: Two times a day (BID) | ORAL | Status: DC
  Administered 2015-06-21 – 2015-06-22 (×2): 25 mg via ORAL
  Filled 2015-06-20 (×3): qty 1

## 2015-06-20 MED ORDER — LISINOPRIL 10 MG PO TABS: 10.0000 mg | ORAL_TABLET | Freq: Every day | ORAL | Status: DC

## 2015-06-20 MED ORDER — HEPARIN SODIUM (PORCINE) 5000 UNIT/ML IJ SOLN
5000.0000 [IU] | Freq: Three times a day (TID) | INTRAMUSCULAR | Status: DC
  Administered 2015-06-20 – 2015-06-28 (×19): 5000 [IU] via SUBCUTANEOUS
  Filled 2015-06-20 (×20): qty 1

## 2015-06-20 MED ORDER — SODIUM BICARBONATE 8.4 % IV SOLN
50.0000 meq | Freq: Once | INTRAVENOUS | Status: AC
  Administered 2015-06-20: 50 meq via INTRAVENOUS
  Filled 2015-06-20: qty 50

## 2015-06-20 MED ORDER — ASPIRIN 81 MG PO TABS: 81.0000 mg | ORAL_TABLET | Freq: Every day | ORAL | Status: DC

## 2015-06-20 MED ORDER — VITAMIN B12 1000 MCG PO TBCR: 1.0000 | EXTENDED_RELEASE_TABLET | Freq: Every day | ORAL | Status: DC

## 2015-06-20 MED ORDER — HEPARIN SODIUM (PORCINE) 1000 UNIT/ML IJ SOLN
3400.0000 [IU] | Freq: Once | INTRAMUSCULAR | Status: AC
  Administered 2015-06-20: 3400 [IU] via INTRAVENOUS

## 2015-06-20 MED ORDER — SODIUM CHLORIDE 0.9 % IJ SOLN
3.0000 mL | Freq: Two times a day (BID) | INTRAMUSCULAR | Status: DC
  Administered 2015-06-20 – 2015-06-26 (×12): 3 mL via INTRAVENOUS

## 2015-06-20 MED ORDER — INSULIN ASPART 100 UNIT/ML ~~LOC~~ SOLN
10.0000 [IU] | Freq: Once | SUBCUTANEOUS | Status: AC
  Administered 2015-06-20: 10 [IU] via INTRAVENOUS
  Filled 2015-06-20: qty 10

## 2015-06-20 MED ORDER — ACETAMINOPHEN 650 MG RE SUPP: 650.0000 mg | Freq: Four times a day (QID) | RECTAL | Status: DC | PRN

## 2015-06-20 MED ORDER — ONDANSETRON HCL 4 MG/2ML IJ SOLN: 4.0000 mg | Freq: Four times a day (QID) | INTRAMUSCULAR | Status: DC | PRN

## 2015-06-20 MED ORDER — SODIUM CHLORIDE 0.9 % IV SOLN
1.0000 g | Freq: Once | INTRAVENOUS | Status: AC
  Administered 2015-06-20: 1 g via INTRAVENOUS

## 2015-06-20 MED ORDER — CLOPIDOGREL BISULFATE 75 MG PO TABS
75.0000 mg | ORAL_TABLET | Freq: Every day | ORAL | Status: DC
Start: 2015-06-20 — End: 2015-06-28
  Administered 2015-06-21 – 2015-06-28 (×7): 75 mg via ORAL
  Filled 2015-06-20 (×8): qty 1

## 2015-06-20 MED ORDER — ASPIRIN EC 81 MG PO TBEC
81.0000 mg | DELAYED_RELEASE_TABLET | Freq: Every day | ORAL | Status: DC
  Administered 2015-06-21 – 2015-06-28 (×7): 81 mg via ORAL
  Filled 2015-06-20 (×8): qty 1

## 2015-06-20 MED ORDER — SERTRALINE HCL 50 MG PO TABS
50.0000 mg | ORAL_TABLET | Freq: Every day | ORAL | Status: DC
  Administered 2015-06-21 – 2015-06-28 (×8): 50 mg via ORAL
  Filled 2015-06-20 (×9): qty 1

## 2015-06-20 MED ORDER — CALCIUM GLUCONATE 10 % IV SOLN
INTRAVENOUS | Status: AC
  Filled 2015-06-20: qty 10

## 2015-06-20 MED ORDER — POLYETHYLENE GLYCOL 3350 17 G PO PACK
17.0000 g | PACK | Freq: Every day | ORAL | Status: DC
  Administered 2015-06-21 – 2015-06-28 (×7): 17 g via ORAL
  Filled 2015-06-20 (×7): qty 1

## 2015-06-20 MED ORDER — AMLODIPINE BESYLATE 10 MG PO TABS
10.0000 mg | ORAL_TABLET | Freq: Every day | ORAL | Status: DC
  Administered 2015-06-20 – 2015-06-28 (×6): 10 mg via ORAL
  Filled 2015-06-20 (×8): qty 1

## 2015-06-20 MED ORDER — LORAZEPAM 2 MG/ML IJ SOLN
0.5000 mg | Freq: Once | INTRAMUSCULAR | Status: DC | PRN
  Administered 2015-06-23: 0.5 mg via INTRAVENOUS
  Filled 2015-06-20: qty 1

## 2015-06-20 MED ORDER — SEVELAMER CARBONATE 800 MG PO TABS
2400.0000 mg | ORAL_TABLET | Freq: Three times a day (TID) | ORAL | Status: DC
  Administered 2015-06-21 – 2015-06-28 (×18): 2400 mg via ORAL
  Filled 2015-06-20 (×17): qty 3

## 2015-06-20 MED ORDER — INSULIN ASPART 100 UNIT/ML ~~LOC~~ SOLN
0.0000 [IU] | Freq: Three times a day (TID) | SUBCUTANEOUS | Status: DC
  Administered 2015-06-21: 3 [IU] via SUBCUTANEOUS
  Administered 2015-06-22: 2 [IU] via SUBCUTANEOUS
  Administered 2015-06-22: 3 [IU] via SUBCUTANEOUS
  Administered 2015-06-23: 5 [IU] via SUBCUTANEOUS
  Administered 2015-06-23 – 2015-06-24 (×3): 2 [IU] via SUBCUTANEOUS
  Administered 2015-06-24: 3 [IU] via SUBCUTANEOUS
  Administered 2015-06-24: 5 [IU] via SUBCUTANEOUS
  Administered 2015-06-25: 7 [IU] via SUBCUTANEOUS
  Administered 2015-06-26: 5 [IU] via SUBCUTANEOUS
  Administered 2015-06-26: 3 [IU] via SUBCUTANEOUS
  Administered 2015-06-27 – 2015-06-28 (×3): 2 [IU] via SUBCUTANEOUS
  Filled 2015-06-20: qty 5
  Filled 2015-06-20 (×2): qty 2
  Filled 2015-06-20 (×2): qty 3
  Filled 2015-06-20: qty 2
  Filled 2015-06-20: qty 5
  Filled 2015-06-20: qty 3
  Filled 2015-06-20: qty 2
  Filled 2015-06-20: qty 8
  Filled 2015-06-20 (×2): qty 2
  Filled 2015-06-20: qty 3
  Filled 2015-06-20: qty 5
  Filled 2015-06-20: qty 2

## 2015-06-20 MED ORDER — ISOSORBIDE MONONITRATE ER 60 MG PO TB24
120.0000 mg | ORAL_TABLET | Freq: Every day | ORAL | Status: DC
  Administered 2015-06-22 – 2015-06-28 (×6): 120 mg via ORAL
  Filled 2015-06-20 (×9): qty 2

## 2015-06-20 MED ORDER — LORAZEPAM 2 MG/ML IJ SOLN: 0.5000 mg | INTRAMUSCULAR | Status: DC

## 2015-06-20 MED ORDER — ONDANSETRON HCL 4 MG PO TABS: 4.0000 mg | ORAL_TABLET | Freq: Four times a day (QID) | ORAL | Status: DC | PRN

## 2015-06-20 MED ORDER — VITAMIN B-12 1000 MCG PO TABS
1000.0000 ug | ORAL_TABLET | Freq: Every day | ORAL | Status: DC
  Administered 2015-06-21 – 2015-06-28 (×7): 1000 ug via ORAL
  Filled 2015-06-20 (×8): qty 1

## 2015-06-20 MED ORDER — LORAZEPAM 2 MG/ML IJ SOLN: 1.0000 mg | INTRAMUSCULAR | Status: DC

## 2015-06-20 MED ORDER — SODIUM POLYSTYRENE SULFONATE 15 GM/60ML PO SUSP
15.0000 g | Freq: Once | ORAL | Status: AC
  Administered 2015-06-20: 15 g via ORAL
  Filled 2015-06-20: qty 60

## 2015-06-20 NOTE — Progress Notes (Signed)
Assessment completed.  SL rt ac flushes well.  Pt agitated when awoken, cursing at staff.  Lt chest HD catheter inplace, dressing dry and intact.  Pt disoriented, reoriented as needed.  States "leave me alone"  CB in reach, SR up x 3, bed alarm on.

## 2015-06-20 NOTE — H&P (Signed)
[facility] [place] [facility]   PATIENT NAME: Caroline Flores    MR#:  [id number]  DATE OF BIRTH:  04-Jun-1920  DATE OF ADMISSION:  06/19/2015  PRIMARY CARE PHYSICIAN: Pcp Not In System   REQUESTING/REFERRING PHYSICIAN: Dr. Zenda Alpers  CHIEF COMPLAINT:   Chief Complaint  Patient presents with  . Generalized Body Aches   chest pain  HISTORY OF PRESENT ILLNESS:  Adora Yeh  is a 81 y.o. female with a known history of end-stage renal disease on hemodialysis-Tuesday/Thursday/Saturday, hypertension, diabetes mellitus, dementia, nursing home resident brought into the emergency room with the complaints of vague chest pain and body aches. According to the patient's grand niece who is with the patient at this time, patient did not have her hemodialysis all last week since no attendant available to go along with the patient and last night nursing home staff mentioned that the patient complained of body aches and chest pain, hence brought to the emergency room. Patient was evaluated by the ED physician and was found to be with stable vitals, comfortably resting with no complaints. Workup revealed elevated progression of 6.0, BUN/creatinine of 97/16.8, troponin 0.09. EKG sinus bradycardia with ventricular rate of 52 bpm, no acute ischemic changes. Chest x-ray vascular congestion. Patient was given soda bicarbonate, insulin and dextrose, calcium gluconate and Kayexalate and hospitalist service was consulted for further management. Patient is comfortably resting  in the bed at this time and denies any complaints such chest pain, shortness of breath, body aches, nausea, vomiting, diarrhea, abdominal pain. Patient is alert awake and oriented to self and is at her baseline mental status because of her dementia per patient's grandniece.  PAST MEDICAL HISTORY:   Past Medical History  Diagnosis Date  . HBP (high blood pressure)   . Diabetes   . Hearing loss   . Memory loss    . Chronic kidney disease   . Dementia     PAST SURGICAL HISTORY:   Past Surgical History  Procedure Laterality Date  . Back surgery      SOCIAL HISTORY:   History  Substance Use Topics  . Smoking status: Current Every Day Smoker  . Smokeless tobacco: Never Used  . Alcohol Use: No    FAMILY HISTORY:  History reviewed. No pertinent family history.  DRUG ALLERGIES:  No Known Allergies  REVIEW OF SYSTEMS:   Review of Systems  Constitutional: Negative for fever, chills and malaise/fatigue.  HENT: Negative for ear pain, hearing loss, nosebleeds, sore throat and tinnitus.   Eyes: Negative for blurred vision, double vision, pain, discharge and redness.  Respiratory: Negative for cough, hemoptysis, sputum production, shortness of breath and wheezing.   Cardiovascular: Positive for chest pain. Negative for palpitations, orthopnea and leg swelling.  Gastrointestinal: Negative for nausea, vomiting, abdominal pain, diarrhea, constipation, blood in stool and melena.  Genitourinary: Negative for dysuria, urgency, frequency and hematuria.  Musculoskeletal: Negative for back pain, joint pain and neck pain.       Body aches +  Skin: Negative for itching and rash.  Neurological: Negative for dizziness, tingling, sensory change, focal weakness and seizures.  Endo/Heme/Allergies: Does not bruise/bleed easily.  Psychiatric/Behavioral: Negative for depression. The patient is not nervous/anxious.     MEDICATIONS AT HOME:   Prior to Admission medications   Medication Sig Start Date End Date Taking? Authorizing Provider  acetaminophen (TYLENOL) 500 MG tablet Take 1,000 mg by mouth 2 (two) times daily.   Yes Historical Provider, MD  amLODipine (NORVASC) 10  MG tablet Take 10 mg by mouth daily.   Yes Historical Provider, MD  clopidogrel (PLAVIX) 75 MG tablet Take 75 mg by mouth daily.   Yes Historical Provider, MD  Cyanocobalamin (VITAMIN B12) 1000 MCG TBCR Take 1 tablet by mouth daily.   Yes  Historical Provider, MD  donepezil (ARICEPT) 5 MG tablet Take 5 mg by mouth at bedtime.   Yes Historical Provider, MD  isosorbide mononitrate (IMDUR) 30 MG 24 hr tablet Take 120 mg by mouth daily.    Yes Historical Provider, MD  lisinopril (PRINIVIL,ZESTRIL) 20 MG tablet Take 20 mg by mouth daily.   Yes Historical Provider, MD  LORazepam (ATIVAN) 2 MG/ML injection Inject 1 mg into the vein once a week. Every Saturday for dialysis   Yes Historical Provider, MD  LORazepam (ATIVAN) 2 MG/ML injection Inject 0.5 mg into the vein once a week. Prior to dialysis on Tues and Thurs   Yes Historical Provider, MD  metoprolol tartrate (LOPRESSOR) 25 MG tablet Take 25 mg by mouth 2 (two) times daily.   Yes Historical Provider, MD  polyethylene glycol (MIRALAX / GLYCOLAX) packet Take 17 g by mouth daily.   Yes Historical Provider, MD  sertraline (ZOLOFT) 50 MG tablet Take 50 mg by mouth daily.   Yes Historical Provider, MD  sevelamer carbonate (RENVELA) 800 MG tablet Take 2,400 mg by mouth 3 (three) times daily with meals.   Yes Historical Provider, MD  aspirin 81 MG tablet Take 81 mg by mouth daily.    Historical Provider, MD      VITAL SIGNS:  Blood pressure 151/51, pulse 62, temperature 98.2 F (36.8 C), temperature source Oral, resp. rate 17, SpO2 97 %.  PHYSICAL EXAMINATION:  Physical Exam  Constitutional: She appears well-developed and well-nourished. No distress.  HENT:  Head: Normocephalic and atraumatic.  Right Ear: External ear normal.  Left Ear: External ear normal.  Nose: Nose normal.  Mouth/Throat: Oropharynx is clear and moist. No oropharyngeal exudate.  Eyes: EOM are normal. Pupils are equal, round, and reactive to light. No scleral icterus.  Neck: Normal range of motion. Neck supple. No JVD present. No thyromegaly present.  Cardiovascular: Normal rate, regular rhythm, normal heart sounds and intact distal pulses.  Exam reveals no friction rub.   No murmur heard. Respiratory: Effort  normal and breath sounds normal. No respiratory distress. She has no wheezes. She has no rales. She exhibits no tenderness.  GI: Soft. Bowel sounds are normal. She exhibits no distension and no mass. There is no tenderness. There is no rebound and no guarding.  Musculoskeletal: Normal range of motion. She exhibits no edema.  Lymphadenopathy:    She has no cervical adenopathy.  Neurological: She is alert. She has normal reflexes. She displays normal reflexes. No cranial nerve deficit. She exhibits normal muscle tone.  Oriented to self  Skin: Skin is warm. No rash noted. No erythema.  Psychiatric: She has a normal mood and affect. Her behavior is normal. Thought content normal.   LABORATORY PANEL:   CBC  Recent Labs Lab 06/20/15 0058  WBC 9.0  HGB 10.1*  HCT 31.3*  PLT 172   ------------------------------------------------------------------------------------------------------------------  Chemistries   Recent Labs Lab 06/20/15 0058  NA 138  K 6.0*  CL 99*  CO2 17*  GLUCOSE 113*  BUN 97*  CREATININE 16.86*  CALCIUM 8.7*  AST 18  ALT 10*  ALKPHOS 50  BILITOT 0.8   ------------------------------------------------------------------------------------------------------------------  Cardiac Enzymes  Recent Labs Lab 06/20/15 0058  TROPONINI  0.09*   ------------------------------------------------------------------------------------------------------------------  RADIOLOGY:  Dg Chest Portable 1 View  06/20/2015   CLINICAL DATA:  Acute onset of chest, back and generalized body pain. Difficulty taking a deep breath. Initial encounter.  EXAM: PORTABLE CHEST - 1 VIEW  COMPARISON:  Chest radiograph performed 10/20/2014  FINDINGS: The lungs are well-aerated. Vascular congestion is noted. Left basilar airspace opacity may reflect mild interstitial edema or possibly pneumonia. There is no evidence of pleural effusion or pneumothorax.  The cardiomediastinal silhouette is borderline  enlarged. A left-sided dual-lumen catheter is noted ending about the cavoatrial junction. No acute osseous abnormalities are seen.  IMPRESSION: Vascular congestion and borderline cardiomegaly. Left basilar airspace opacity may reflect mild interstitial edema or possibly pneumonia.   Electronically Signed   By: Roanna Raider M.D.   On: 06/20/2015 01:09    EKG:   Orders placed or performed during the hospital encounter of 06/19/15  . ED EKG  . ED EKG  Sinus bradycardia with ventricular rate of 52 bpm, no acute ST-T changes.  IMPRESSION AND PLAN:   1. Chest pain and body aches-atypical. 2. Elevated troponin, acute versus chronic, demand ischemia versus related to chronic kidney disease. 3. End-stage renal disease on hemodialysis-Tuesday/Thursday/Saturday. Missed HD all last week because of nonavailability of attendant to accompany her to HD. Creatinine elevated. 4. Hyperkalemia secondary to end-stage renal disease. Received calcium gluconate, sodium bicarbonate, insulin and D50 and Kayexalate in the ED. 5. Hypertension, stable on home medications. 6. Diabetes mellitus type 2 stable clinically. 7. Dementia, stable. Plan: Admit to telemetry, cycle cardiac enzymes, continue aspirin and beta blocker. Echocardiogram and cardiology consultation requested for further evaluation and advice. Nephrology consultation requested for initiation of hemodialysis. Follow-up BMP. Continue home medications for hypertension except for holding lisinopril because of hyperkalemia. Sliding scale insulin, follow-up blood sugars. Continue home meds for dementia along with supportive care.     All the records are reviewed and case discussed with ED provider. Management plans discussed with the patient, family and they are in agreement.  CODE STATUS: Full code  TOTAL TIME TAKING CARE OF THIS PATIENT: 50  minutes.    Jonnie Kind N M.D on 06/20/2015 at 3:37 AM  Between 7am to 6pm - Pager -  [phone]  After 6pm go to [email or web address] - password [email or web address]  [facility]  Office  [phone]  CC: Primary care physician; Pcp Not In System

## 2015-06-20 NOTE — Progress Notes (Signed)
CVT at bedside performing 2D echo

## 2015-06-20 NOTE — Care Management Note (Signed)
Patient was treating at [facility] [address] was discharged last week due to a lack of family support with patient during her dialysis treatments and patients behavior while at the clinic.  Center informed me that great niece was part of the decision to stop dialysis because of behavior and family being unable to attend each treatment with her to help keep her calm. At this time patient does not have a active outpatient placement. Ivor Reining  Dialysis Liaison [phone]

## 2015-06-20 NOTE — Progress Notes (Signed)
Swabbed nose for MRSA culture, pt became agitated, swinging at staff, spitting.  Unable to get VS or CBG at this time.  Will try later.

## 2015-06-20 NOTE — Progress Notes (Signed)
[facility]   PATIENT NAME: Caroline Flores    MR#:  [id number]  DATE OF BIRTH:  October 01, 1920  SUBJECTIVE:  CHIEF COMPLAINT:   Chief Complaint  Patient presents with  . Generalized Body Aches    Pt is confused and not able to tell anything.  REVIEW OF SYSTEMS:  Pt is not able to tell. Confused.  ROS  DRUG ALLERGIES:  No Known Allergies  VITALS:  Blood pressure 148/62, pulse 65, temperature 99.3 F (37.4 C), temperature source Oral, resp. rate 14, height 5\' 4"  (1.626 m), weight 64.5 kg (142 lb 3.2 oz), SpO2 95 %.  PHYSICAL EXAMINATION:  GENERAL:  81 y.o.-year-old patient lying in the bed with no acute distress.  EYES: Pupils equal, round, reactive to light and accommodation. No scleral icterus. Extraocular muscles intact.  HEENT: Head atraumatic, normocephalic. Oropharynx and nasopharynx clear.  NECK:  Supple, no jugular venous distention. No thyroid enlargement, no tenderness.  LUNGS: Normal breath sounds bilaterally, no wheezing, positive crepitation. No use of accessory muscles of respiration.  CARDIOVASCULAR: S1, S2 normal. No murmurs, rubs, or gallops.  ABDOMEN: Soft, nontender, nondistended. Bowel sounds present. No organomegaly or mass.  EXTREMITIES: No pedal edema, cyanosis, or clubbing.  NEUROLOGIC: Cranial nerves II through XII are intact. Muscle strength 3-4 /5 in all extremities. Not able to check further- as she is confused and not very co-operative. PSYCHIATRIC: The patient is confused and somewhat agitated. SKIN: No obvious rash, lesion, or ulcer.   Physical Exam LABORATORY PANEL:   CBC  Recent Labs Lab 06/20/15 0058  WBC 9.0  HGB 10.1*  HCT 31.3*  PLT 172   ------------------------------------------------------------------------------------------------------------------  Chemistries   Recent Labs Lab 06/20/15 0058 06/20/15 0541  NA 138 139  K 6.0* 4.9  CL 99* 99*  CO2 17* 20*  GLUCOSE 113* 89   BUN 97* 120*  CREATININE 16.86* 17.32*  CALCIUM 8.7* 9.1  AST 18  --   ALT 10*  --   ALKPHOS 50  --   BILITOT 0.8  --    ------------------------------------------------------------------------------------------------------------------  Cardiac Enzymes  Recent Labs Lab 06/20/15 0058 06/20/15 0541  TROPONINI 0.09* 0.08*   ------------------------------------------------------------------------------------------------------------------  RADIOLOGY:  Dg Chest Portable 1 View  06/20/2015   CLINICAL DATA:  Acute onset of chest, back and generalized body pain. Difficulty taking a deep breath. Initial encounter.  EXAM: PORTABLE CHEST - 1 VIEW  COMPARISON:  Chest radiograph performed 10/20/2014  FINDINGS: The lungs are well-aerated. Vascular congestion is noted. Left basilar airspace opacity may reflect mild interstitial edema or possibly pneumonia. There is no evidence of pleural effusion or pneumothorax.  The cardiomediastinal silhouette is borderline enlarged. A left-sided dual-lumen catheter is noted ending about the cavoatrial junction. No acute osseous abnormalities are seen.  IMPRESSION: Vascular congestion and borderline cardiomegaly. Left basilar airspace opacity may reflect mild interstitial edema or possibly pneumonia.   Electronically Signed   By: Roanna Raider M.D.   On: 06/20/2015 01:09    ASSESSMENT AND PLAN:   *  Chest pain and body aches-atypical.     With Elevated troponin, acute versus chronic, demand ischemia versus related to chronic kidney disease.      Called Cardio consult, Monitor on tele. Likely due to fluid overload too. * End-stage renal disease on hemodialysis-Tuesday/Thursday/Saturday. Missed HD all last week because of nonavailability of attendant to accompany her to HD. Creatinine elevated.   Nephrology consult to help with HD here. * Hyperkalemia secondary to end-stage renal  disease. Received calcium gluconate, sodium bicarbonate, insulin and D50 and  Kayexalate in the ED. HD today * Altered mental status today- due to uremia.   Manage with HD, Ativan PRN. * Hypertension, stable on home medications. * Diabetes mellitus type 2 stable clinically. * Dementia, stable.  All the records are reviewed and case discussed with Care Management/Social Workerr. Management plans discussed with the patient, family and they are in agreement.  CODE STATUS: full.  TOTAL TIME TAKING CARE OF THIS PATIENT:35 minutes.     POSSIBLE D/C IN 2-3 DAYS, DEPENDING ON CLINICAL CONDITION.   Altamese Dilling M.D on 06/20/2015   Between 7am to 6pm - Pager - [phone]  After 6pm go to [email or web address] - password [email or web address]  [facility]  Office  [phone]  CC: Primary care physician; Pcp Not In System

## 2015-06-20 NOTE — ED Provider Notes (Signed)
[facility] Emergency Department Provider Note  ____________________________________________  Time seen: Approximately 2338   I have reviewed the triage vital signs and the nursing notes.   HISTORY  Chief Complaint Generalized Body Aches  The patient has dementia  HPI Caroline Flores is a 81 y.o. female who was sent in by her nursing home with generalized body achesand chest pain. Per the family member who is with the patient the patient has not had dialysis this week. She typically gets dialysis on Tuesdays Thursdays and Saturdays. The family member reports that because of her dementia she does not always sit for her dialysis so the family member goes with her for dialysis. She reports that she was unable to do so this week. Per EMS the patient was also complaining of some chest pain. She tried to pull out her dialysis catheter today. She reports that she has some mild chest pain that is not bothering her but denies any abdominal pain and has not had any vomiting. The patient currently is just reporting that she is hungry.   Past Medical History  Diagnosis Date  . HBP (high blood pressure)   . Diabetes   . Hearing loss   . Memory loss     There are no active problems to display for this patient.   Past Surgical History  Procedure Laterality Date  . Back surgery      Current Outpatient Rx  Name  Route  Sig  Dispense  Refill  . acetaminophen (TYLENOL) 500 MG tablet   Oral   Take 1,000 mg by mouth 2 (two) times daily.         Marland Kitchen amLODipine (NORVASC) 10 MG tablet   Oral   Take 10 mg by mouth daily.         . clopidogrel (PLAVIX) 75 MG tablet   Oral   Take 75 mg by mouth daily.         . Cyanocobalamin (VITAMIN B12) 1000 MCG TBCR   Oral   Take 1 tablet by mouth daily.         Marland Kitchen donepezil (ARICEPT) 5 MG tablet   Oral   Take 5 mg by mouth at bedtime.         . isosorbide mononitrate (IMDUR) 30 MG 24 hr tablet   Oral   Take 120 mg  by mouth daily.          Marland Kitchen lisinopril (PRINIVIL,ZESTRIL) 20 MG tablet   Oral   Take 20 mg by mouth daily.         Marland Kitchen LORazepam (ATIVAN) 2 MG/ML injection   Intravenous   Inject 1 mg into the vein once a week. Every Saturday for dialysis         . LORazepam (ATIVAN) 2 MG/ML injection   Intravenous   Inject 0.5 mg into the vein once a week. Prior to dialysis on Tues and Thurs         . metoprolol tartrate (LOPRESSOR) 25 MG tablet   Oral   Take 25 mg by mouth 2 (two) times daily.         . polyethylene glycol (MIRALAX / GLYCOLAX) packet   Oral   Take 17 g by mouth daily.         . sertraline (ZOLOFT) 50 MG tablet   Oral   Take 50 mg by mouth daily.         . sevelamer carbonate (RENVELA) 800 MG tablet   Oral  Take 2,400 mg by mouth 3 (three) times daily with meals.         Marland Kitchen aspirin 81 MG tablet   Oral   Take 81 mg by mouth daily.           Allergies Review of patient's allergies indicates no known allergies.  History reviewed. No pertinent family history.  Social History History  Substance Use Topics  . Smoking status: Current Every Day Smoker  . Smokeless tobacco: Never Used  . Alcohol Use: No    Review of Systems Constitutional: No fever/chills Eyes: No visual changes. ENT: No sore throat. Cardiovascular:  chest pain. Respiratory: Denies shortness of breath. Gastrointestinal:  abdominal pain.  No nausea, no vomiting.  No diarrhea.  No constipation. Genitourinary: Negative for dysuria. Musculoskeletal:  for back pain. Skin: Negative for rash. Neurological: Negative for headaches, focal weakness or numbness. Psychiatric:Dementia  10-point ROS otherwise negative.  ____________________________________________   PHYSICAL EXAM:  VITAL SIGNS: ED Triage Vitals  Enc Vitals Group     BP 06/19/15 2333 168/57 mmHg     Pulse Rate 06/19/15 2333 55     Resp 06/19/15 2333 20     Temp 06/19/15 2333 98.2 F (36.8 C)     Temp Source  06/19/15 2333 Oral     SpO2 06/19/15 2333 98 %     Weight --      Height --      Head Cir --      Peak Flow --      Pain Score 06/19/15 2322 9     Pain Loc --      Pain Edu? --      Excl. in GC? --     Constitutional: Alert and oriented. Well appearing and in no acute distress. Eyes: Conjunctivae are normal. PERRL. EOMI. Head: Atraumatic. Nose: No congestion/rhinnorhea. Mouth/Throat: Mucous membranes are moist.  Oropharynx non-erythematous. Cardiovascular: Normal rate, regular rhythm. Systolic murmur.  Good peripheral circulation. Respiratory: Normal respiratory effort.  No retractions. Lungs CTAB. Gastrointestinal: Soft and nontender. No distention. Positive bowel sounds Musculoskeletal:  lower extremity  edema.   Neurologic:  Normal speech and language. Skin:  Skin is warm, dry and intact. No rash noted. Psychiatric: Mood and affect are normal.  ____________________________________________   LABS (all labs ordered are listed, but only abnormal results are displayed)  Labs Reviewed  CBC - Abnormal; Notable for the following:    RBC 3.51 (*)    Hemoglobin 10.1 (*)    HCT 31.3 (*)    RDW 16.7 (*)    All other components within normal limits  COMPREHENSIVE METABOLIC PANEL - Abnormal; Notable for the following:    Potassium 6.0 (*)    Chloride 99 (*)    CO2 17 (*)    Glucose, Bld 113 (*)    BUN 97 (*)    Creatinine, Ser 16.86 (*)    Calcium 8.7 (*)    Albumin 3.2 (*)    ALT 10 (*)    GFR calc non Af Amer 2 (*)    GFR calc Af Amer 2 (*)    Anion gap 22 (*)    All other components within normal limits  TROPONIN I - Abnormal; Notable for the following:    Troponin I 0.09 (*)    All other components within normal limits  COMPREHENSIVE METABOLIC PANEL   ____________________________________________  EKG  ED ECG REPORT I, Rebecka Apley, the attending physician, personally viewed and interpreted this ECG.  Date: 06/20/2015  EKG Time: 145  Rate: 52   Rhythm: sinus bradycardia  Axis: normal  Intervals:none  ST&T Change: none  ____________________________________________  RADIOLOGY  Chest x-ray: Vascular congestion and borderline cardiomegaly, left basilar airspace opacity may reflect mild interstitial edema or possibly pneumonia. ____________________________________________   PROCEDURES  Procedure(s) performed: None  Critical Care performed: Yes, see critical care note(s)   CRITICAL CARE Performed by: Lucrezia Europe P   Total critical care time: 30 min  Critical care time was exclusive of separately billable procedures and treating other patients.  Critical care was necessary to treat or prevent imminent or life-threatening deterioration.  Critical care was time spent personally by me on the following activities: development of treatment plan with patient and/or surrogate as well as nursing, discussions with consultants, evaluation of patient's response to treatment, examination of patient, obtaining history from patient or surrogate, ordering and performing treatments and interventions, ordering and review of laboratory studies, ordering and review of radiographic studies, pulse oximetry and re-evaluation of patient's condition.   ____________________________________________   INITIAL IMPRESSION / ASSESSMENT AND PLAN / ED COURSE  Pertinent labs & imaging results that were available during my care of the patient were reviewed by me and considered in my medical decision making (see chart for details).  This is a 81 year old female with a history of end-stage renal disease on dialysis who comes in today with multiple complaints that most immediate being chest pain. The patient has not had dialysis this week. We will check some blood work as well as a chest x-ray and reassess the patient once we've received some results.  The patient has an elevated potassium. I will give her some calcium chloride, sodium bicarbonate,  insulin, D50 and Kayexalate. I will admit the patient to the hospitalist service as she also has an elevated troponin. I did contact Dr. Betti Cruz who will admit the patient. The patient's vital signs were unremarkable while the emergency department. ____________________________________________   FINAL CLINICAL IMPRESSION(S) / ED DIAGNOSES  Final diagnoses:  Hyperkalemia  Chest pain, unspecified chest pain type  Elevated troponin      Rebecka Apley, MD 06/20/15 0300

## 2015-06-20 NOTE — Progress Notes (Signed)
*  PRELIMINARY RESULTS* Echocardiogram 2D Echocardiogram has been performed.  Georgann Housekeeper Hege 06/20/2015, 1:00 PM

## 2015-06-20 NOTE — Progress Notes (Signed)
Spoke with niece Maryelizabeth Kaufmann to update her on pt's condition and need for HD.  Telephone consent obtained with help of Pilar Plate, RN

## 2015-06-20 NOTE — Progress Notes (Signed)
To Dialysis via bed 

## 2015-06-20 NOTE — Consult Note (Addendum)
Cardiology Consultation Note  Patient ID: Caroline Flores, MRN: [id number], DOB/AGE: 1920-03-28 81 y.o. Admit date: 06/19/2015   Date of Consult: 06/20/2015 Primary Physician: Pcp Not In System Primary Cardiologist: None  Chief Complaint: Body aches Reason for Consult: Chest pain, elevated troponin  HPI: 81 y.o. female with h/o CKD stage V (on TTS dialysis), hypertension, diabetes managed with lifestyle, and dementia (comes from assisted living) who presented to the [facility] ED on 06/19/15 with complaints of two days of vague/generalize body ache, mainly abdomen, chest and back.  She had an Echo in 03/2014.  A new Echo has been ordered during this visit.  In the ED on 8/8, workup revealed elevated K+ of 6.0, BUN/creatinine of 97/16.8, troponin 0.09. EKG sinus bradycardia with ventricular rate of 52 bpm, no acute ischemic changes. Chest x-ray vascular congestion. Patient was given sodium bicarbonate, insulin and dextrose, calcium gluconate and Kayexalate and hospitalist service was consulted for further management.   Caroline Flores missed a week of dialysis prior to arrival at the ED d/t her dementia and no availability of a family member to sit with her this week.  Her hx was difficult to obtain today because of her dementia and the lack of a family member at bedside. Patient is comfortably resting in the bed at this time and denies current chest pain, shortness of breath, body aches, nausea, vomiting, diarrhea, or abdominal pain.   Today (8/9): K+ 4.9; troponin flat trending (0.09-->0.08); Tele NSR, 63 BPM  Past Medical History  Diagnosis Date  . HTN (hypertension)   . Diabetes mellitus, type II   . Hearing loss   . Memory loss   . Chronic kidney disease (CKD), stage V     GFR 2  . Dementia       Most Recent Cardiac Studies: Echo 04/06/14: Care Everywhere, Dr. Arnoldo Hooker Moderate MR, Moderate AS, EF >55% Moderate LVH, normal RV systolic function  Echo 06/20/15: Pending   Surgical  History:  Past Surgical History  Procedure Laterality Date  . Back surgery       Home Meds: Prior to Admission medications   Medication Sig Start Date End Date Taking? Authorizing Provider  acetaminophen (TYLENOL) 500 MG tablet Take 1,000 mg by mouth 2 (two) times daily.   Yes Historical Provider, MD  amLODipine (NORVASC) 10 MG tablet Take 10 mg by mouth daily.   Yes Historical Provider, MD  clopidogrel (PLAVIX) 75 MG tablet Take 75 mg by mouth daily.   Yes Historical Provider, MD  Cyanocobalamin (VITAMIN B12) 1000 MCG TBCR Take 1 tablet by mouth daily.   Yes Historical Provider, MD  donepezil (ARICEPT) 5 MG tablet Take 5 mg by mouth at bedtime.   Yes Historical Provider, MD  isosorbide mononitrate (IMDUR) 30 MG 24 hr tablet Take 120 mg by mouth daily.    Yes Historical Provider, MD  lisinopril (PRINIVIL,ZESTRIL) 20 MG tablet Take 20 mg by mouth daily.   Yes Historical Provider, MD  LORazepam (ATIVAN) 2 MG/ML injection Inject 1 mg into the vein once a week. Every Saturday for dialysis   Yes Historical Provider, MD  LORazepam (ATIVAN) 2 MG/ML injection Inject 0.5 mg into the vein once a week. Prior to dialysis on Tues and Thurs   Yes Historical Provider, MD  metoprolol tartrate (LOPRESSOR) 25 MG tablet Take 25 mg by mouth 2 (two) times daily.   Yes Historical Provider, MD  polyethylene glycol (MIRALAX / GLYCOLAX) packet Take 17 g by mouth daily.   Yes  Historical Provider, MD  sertraline (ZOLOFT) 50 MG tablet Take 50 mg by mouth daily.   Yes Historical Provider, MD  sevelamer carbonate (RENVELA) 800 MG tablet Take 2,400 mg by mouth 3 (three) times daily with meals.   Yes Historical Provider, MD  aspirin 81 MG tablet Take 81 mg by mouth daily.    Historical Provider, MD    Inpatient Medications:  . amLODipine  10 mg Oral Daily  . aspirin EC  81 mg Oral Daily  . clopidogrel  75 mg Oral Daily  . donepezil  5 mg Oral QHS  . heparin  5,000 Units Subcutaneous 3 times per day  . insulin aspart   0-15 Units Subcutaneous TID WC  . isosorbide mononitrate  120 mg Oral Daily  . LORazepam  0.5 mg Intravenous Weekly  . LORazepam  1 mg Intravenous Weekly  . metoprolol tartrate  25 mg Oral BID  . polyethylene glycol  17 g Oral Daily  . sertraline  50 mg Oral Daily  . sevelamer carbonate  2,400 mg Oral TID WC  . sodium chloride  3 mL Intravenous Q12H  . vitamin B-12  1,000 mcg Oral Daily      Allergies: No Known Allergies  History   Social History  . Marital Status: Widowed    Spouse Name: N/A  . Number of Children: N/A  . Years of Education: N/A   Occupational History  . Not on file.   Social History Main Topics  . Smoking status: Current Every Day Smoker  . Smokeless tobacco: Never Used  . Alcohol Use: No  . Drug Use: No  . Sexual Activity: Not on file   Other Topics Concern  . Not on file   Social History Narrative     History reviewed. No pertinent family history.   Review of Systems:limited d/t patients mental state Review of Systems  Constitutional: Positive for malaise/fatigue. Negative for fever and chills.  HENT: Positive for hearing loss.   Eyes: Negative for discharge and redness.  Respiratory: Positive for shortness of breath and wheezing.   Cardiovascular: Positive for chest pain. Negative for leg swelling.  Gastrointestinal: Positive for abdominal pain. Negative for nausea, vomiting and diarrhea.  Musculoskeletal: Positive for myalgias and back pain. Negative for falls.  Neurological: Positive for weakness. Negative for loss of consciousness.  Endo/Heme/Allergies: Does not bruise/bleed easily.  Psychiatric/Behavioral: Positive for memory loss.     Labs:  Recent Labs  06/20/15 0058 06/20/15 0541  TROPONINI 0.09* 0.08*   Lab Results  Component Value Date   WBC 9.0 06/20/2015   HGB 10.1* 06/20/2015   HCT 31.3* 06/20/2015   MCV 89.1 06/20/2015   PLT 172 06/20/2015     Recent Labs Lab 06/20/15 0058 06/20/15 0541  NA 138 139  K 6.0*  4.9  CL 99* 99*  CO2 17* 20*  BUN 97* 120*  CREATININE 16.86* 17.32*  CALCIUM 8.7* 9.1  PROT 6.5  --   BILITOT 0.8  --   ALKPHOS 50  --   ALT 10*  --   AST 18  --   GLUCOSE 113* 89   Lab Results  Component Value Date   CHOL 146 10/16/2014   HDL 65* 10/16/2014   LDLCALC 70 10/16/2014   TRIG 55 10/16/2014   No results found for: DDIMER  Radiology/Studies:  Dg Chest Portable 1 View  06/20/2015   CLINICAL DATA:  Acute onset of chest, back and generalized body pain. Difficulty taking a deep breath. Initial  encounter.  EXAM: PORTABLE CHEST - 1 VIEW  COMPARISON:  Chest radiograph performed 10/20/2014  FINDINGS: The lungs are well-aerated. Vascular congestion is noted. Left basilar airspace opacity may reflect mild interstitial edema or possibly pneumonia. There is no evidence of pleural effusion or pneumothorax.  The cardiomediastinal silhouette is borderline enlarged. A left-sided dual-lumen catheter is noted ending about the cavoatrial junction. No acute osseous abnormalities are seen.  IMPRESSION: Vascular congestion and borderline cardiomegaly. Left basilar airspace opacity may reflect mild interstitial edema or possibly pneumonia.   Electronically Signed   By: Roanna Raider M.D.   On: 06/20/2015 01:09    EKG: Sinus bradycardia, 52 BPM, diffuse peaked T's  Weights: Filed Weights   06/20/15 0516  Weight: 142 lb 12.8 oz (64.774 kg)     Physical Exam:limited by patient's compliance and ability to move Blood pressure 160/58, pulse 66, temperature 99 F (37.2 C), temperature source Oral, resp. rate 17, weight 142 lb 12.8 oz (64.774 kg), SpO2 93 %. Body mass index is 24.5 kg/(m^2). General: Frail, elderly woman, In no acute distress.  Laying in sleeping prior to arrival.  Family not present at bedside at that time. Head: Normocephalic, atraumatic, sclera non-icteric, no xanthomas, nares are without discharge.  Neck: Negative for carotid bruits. JVD not elevated. Lungs: Clear  bilaterally to auscultation without wheezes, rales, or rhonchi. Breathing is unlabored.  Exam limited by by patient's compliance and ability to move Heart: RRR with S1 S2. III murmur at right sternal border that radiates to carotids, II-III murmur at apex. No rubs or gallops appreciated. Abdomen: Soft, non-tender, non-distended with normoactive bowel sounds. No hepatomegaly. No rebound/guarding. No obvious abdominal masses. Msk:  Strength and tone appear normal for age and dialysis status. Extremities: No clubbing or cyanosis. No edema.  Distal pedal pulses are 2+ and equal bilaterally. Neuro: Alert and oriented X 3. No facial asymmetry. No focal deficit. Moves all extremities spontaneously. Psych:  Has difficulty responding to questions and seems confused at times possibly d/t dementia.    Assessment and Plan:   1. Hyperkalemia secondary to end-stage renal disease.  -Resolved -Received calcium gluconate, sodium bicarbonate, insulin and D50, and Kayexalate in the ED on 8/8 -Scheduled to receive dialysis on 8/9, however at this time patient is not interested in going  -Continue to monitor daily metabolic panels  2. End-stage renal disease on hemodialysis-Tuesday/Thursday/Saturday.  -Missed HD all last week because of nonavailability of attendant to accompany her to HD. Creatinine elevated. -Dialysis planned as above  -Nephrology consulted  3. Generalized body aches with vague chest pain and abdominal pain -Likely d/t fluid overload and missed dialysis -CXR showing vascular congestion -Troponins flat trending (0.09-->0.08) -Dialysis planned as above -Agree with continuing ASA   4. Elevated troponin  -Acute versus chronic, demand ischemia versus related to chronic kidney disease -Troponins flat trending (0.09-->0.08) -Not a good candidate for ischemic w/u -Echo ordered for this admission, questionable whether she will allow this procedure to be done or not d/t her dementia  5.  Hypertension  -Stable  -Per PCP notes: metoprolol is no longer one of her medications -Discontinued metoprolol 2/2 bradycardia, controlled hypertension -Continue to hold lisinopril in the setting of recent hyperkalemia, may consider at a later time -Continue Amlodipine  6. Diabetes mellitus type 2  -Per IM -Stable clinically, no home medications -Sliding scale insulin ordered  7. Dementia -Per IM -Stable, on Aricept   SignedEula Listen, PA-C Pager: [phone] 06/20/2015, 10:07 AM    Attending Note  Patient seen and examined, agree with detailed note above,  Patient presentation and plan discussed on rounds.   Patient is a poor historian Per the notes, she has missed several rounds of dialysis Developed chest fullness on arrival general aches,  Noted to have significant electrolyte abnormalities Given medications for elevated potassium greater than 6 --Potassium has now improved into normal range Consult to nephrology pending   troponin of 0.09 and 0.08 in the setting of normal EKG is not particularly worrisome --Currently denies any symptoms of angina and in fact does not want to be touched.  Basics of exam performed. She appears comfortable and in no distress.  There is clearly aortic valve stenosis appreciated, MR murmur as well as. Medical management based off prior valve numbers from echocardiogram in 2015 Significant discrepancies in the [place] echo as it reports moderate LV dysfunction but then details ejection fraction greater than 55% , moderate aortic valve stenosis but gradient is only mild . Repeat echocardiogram has been ordered   --At this time would manage medically. No ischemia workup at this time . She would not likely cooperate with stress testing . No indication for cardiac catheterization at this time .  --She is in need of dialysis to get back on track. Some of her chest fullness may be secondary to missing dialysis and fluid  overload.   Signed: Dossie Arbour  M.D., Ph.D. [facility]

## 2015-06-20 NOTE — ED Notes (Signed)
[facility] main lab called, Aria answered, notified that phlebotomy needed to draw CMP. States will send one.

## 2015-06-20 NOTE — Progress Notes (Signed)
Subjective:  Patient known to our practice from outpatient dialysis. She was recently discharged from her dialysis unit due to lack of family support during dialysis treatment. Patient would not get on the machine without her great niece being present there. Her niece is having some family issues due to which she is not able to attend patient's treatment. At present, she is admitted for elevated potassium. Potassium level was 6.0 yesterday. BUN/creatinine level are critically high.  Objective:  Vital signs in last 24 hours:  Temp:  [98.2 F (36.8 C)-99.3 F (37.4 C)] 99.3 F (37.4 C) (08/09 1250) Pulse Rate:  [44-71] 65 (08/09 1250) Resp:  [12-20] 14 (08/09 1250) BP: (128-168)/(40-99) 148/62 mmHg (08/09 1250) SpO2:  [93 %-100 %] 95 % (08/09 1250) Weight:  [64.5 kg (142 lb 3.2 oz)-64.774 kg (142 lb 12.8 oz)] 64.5 kg (142 lb 3.2 oz) (08/09 1000)  Weight change:  Filed Weights   06/20/15 0516 06/20/15 1000  Weight: 64.774 kg (142 lb 12.8 oz) 64.5 kg (142 lb 3.2 oz)    Intake/Output:       Physical Exam: General:  laying in the bed, lethargic   HEENT  anicteric sclera, moist mucous membranes   Neck  supple, no masses   Pulm/lungs  clear to auscultation anteriorly and laterally   CVS/Heart  probably pericardial rub, regular rhythm   Abdomen:   soft, nontender, nondistended   Extremities:  no peripheral edema   Neurologic:  lethargic but arousable. Baseline dementia.   Skin:  no acute rashes   Access:  PermCath        Basic Metabolic Panel:  Recent Labs Lab 06/20/15 0058 06/20/15 0541  NA 138 139  K 6.0* 4.9  CL 99* 99*  CO2 17* 20*  GLUCOSE 113* 89  BUN 97* 120*  CREATININE 16.86* 17.32*  CALCIUM 8.7* 9.1     CBC:  Recent Labs Lab 06/20/15 0058  WBC 9.0  HGB 10.1*  HCT 31.3*  MCV 89.1  PLT 172      Microbiology: Results for orders placed or performed during the hospital encounter of 06/19/15  MRSA PCR Screening     Status: None   Collection  Time: 06/20/15 11:30 AM  Result Value Ref Range Status   MRSA by PCR NEGATIVE NEGATIVE Final    Comment:        The GeneXpert MRSA Assay (FDA approved for NASAL specimens only), is one component of a comprehensive MRSA colonization surveillance program. It is not intended to diagnose MRSA infection nor to guide or monitor treatment for MRSA infections.     Coagulation Studies: No results for input(s): LABPROT, INR in the last 72 hours.  Urinalysis: No results for input(s): COLORURINE, LABSPEC, PHURINE, GLUCOSEU, HGBUR, BILIRUBINUR, KETONESUR, PROTEINUR, UROBILINOGEN, NITRITE, LEUKOCYTESUR in the last 72 hours.  Invalid input(s): APPERANCEUR    Imaging: Dg Chest Portable 1 View  06/20/2015   CLINICAL DATA:  Acute onset of chest, back and generalized body pain. Difficulty taking a deep breath. Initial encounter.  EXAM: PORTABLE CHEST - 1 VIEW  COMPARISON:  Chest radiograph performed 10/20/2014  FINDINGS: The lungs are well-aerated. Vascular congestion is noted. Left basilar airspace opacity may reflect mild interstitial edema or possibly pneumonia. There is no evidence of pleural effusion or pneumothorax.  The cardiomediastinal silhouette is borderline enlarged. A left-sided dual-lumen catheter is noted ending about the cavoatrial junction. No acute osseous abnormalities are seen.  IMPRESSION: Vascular congestion and borderline cardiomegaly. Left basilar airspace opacity may reflect mild interstitial  edema or possibly pneumonia.   Electronically Signed   By: Roanna Raider M.D.   On: 06/20/2015 01:09     Medications:     . amLODipine  10 mg Oral Daily  . aspirin EC  81 mg Oral Daily  . clopidogrel  75 mg Oral Daily  . donepezil  5 mg Oral QHS  . heparin  5,000 Units Subcutaneous 3 times per day  . insulin aspart  0-15 Units Subcutaneous TID WC  . isosorbide mononitrate  120 mg Oral Daily  . lisinopril  10 mg Oral Daily  . metoprolol tartrate  25 mg Oral BID  . polyethylene  glycol  17 g Oral Daily  . sertraline  50 mg Oral Daily  . sevelamer carbonate  2,400 mg Oral TID WC  . sodium chloride  3 mL Intravenous Q12H  . vitamin B-12  1,000 mcg Oral Daily   acetaminophen **OR** acetaminophen, LORazepam, ondansetron **OR** ondansetron (ZOFRAN) IV  Assessment/ Plan:  81 y.o. female with end-stage renal disease, hypertension, dementia, diabetes  1. End-stage renal disease with Hyperkalemia. Patient is not currently enrolled in chronic dialysis program because of lack of family support. Patient would not get on the machine without her great niece being present there.  She would cause disruption in the treatment of other dialysis patient is therefore she was discharged from the outpatient dialysis unit. Patient's great niece has some family issues due to which she is not able to sit with the patient.  Socially, it is a very difficult situation. Currently, patient is uremic and has high potassium We are going to dialyze her daily for the next few days. I'm not sure what the ultimate plan would be except PRN dialysis for uremia / electrolyte disturbances 2. Anemia of chronic kidney disease. Hemoglobin 10.1. - We will prescribe Procrit during dialysis treatment 3. Secondary hyperparathyroidism - Sevelamer 2400 mg 3 times a day with meals   LOS: 0 Yamila Cragin 8/9/20164:00 PM

## 2015-06-21 LAB — RENAL FUNCTION PANEL
ALBUMIN: 2.7 g/dL — AB (ref 3.5–5.0)
Anion gap: 20 — ABNORMAL HIGH (ref 5–15)
BUN: 75 mg/dL — ABNORMAL HIGH (ref 6–20)
CALCIUM: 8.1 mg/dL — AB (ref 8.9–10.3)
CO2: 25 mmol/L (ref 22–32)
Chloride: 93 mmol/L — ABNORMAL LOW (ref 101–111)
Creatinine, Ser: 12.07 mg/dL — ABNORMAL HIGH (ref 0.44–1.00)
GFR, EST AFRICAN AMERICAN: 3 mL/min — AB (ref >60)
GFR, EST NON AFRICAN AMERICAN: 2 mL/min — AB (ref >60)
Glucose, Bld: 86 mg/dL (ref 65–99)
PHOSPHORUS: 6.1 mg/dL — AB (ref 2.5–4.6)
Potassium: 3.6 mmol/L (ref 3.5–5.1)
SODIUM: 138 mmol/L (ref 135–145)

## 2015-06-21 LAB — GLUCOSE, CAPILLARY
GLUCOSE-CAPILLARY: 182 mg/dL — AB (ref 65–99)
Glucose-Capillary: 87 mg/dL (ref 65–99)
Glucose-Capillary: 100 mg/dL — ABNORMAL HIGH (ref 65–99)
Glucose-Capillary: 113 mg/dL — ABNORMAL HIGH (ref 65–99)

## 2015-06-21 LAB — HEPATITIS B SURFACE ANTIGEN: Hepatitis B Surface Ag: NEGATIVE

## 2015-06-21 LAB — HEPATITIS B SURFACE ANTIBODY, QUANTITATIVE

## 2015-06-21 MED ORDER — RISPERIDONE 3 MG PO TABS
3.0000 mg | ORAL_TABLET | Freq: Every day | ORAL | Status: DC
  Administered 2015-06-23 – 2015-06-26 (×4): 3 mg via ORAL
  Filled 2015-06-21 (×5): qty 1

## 2015-06-21 MED ORDER — EPOETIN ALFA 10000 UNIT/ML IJ SOLN
4000.0000 [IU] | INTRAMUSCULAR | Status: DC
  Administered 2015-06-21 – 2015-06-24 (×3): 4000 [IU] via INTRAVENOUS
  Filled 2015-06-21: qty 1

## 2015-06-21 MED ORDER — NEPRO/CARBSTEADY PO LIQD
237.0000 mL | Freq: Every day | ORAL | Status: DC
  Administered 2015-06-21 – 2015-06-25 (×4): 237 mL via ORAL

## 2015-06-21 MED ORDER — LISINOPRIL 20 MG PO TABS
40.0000 mg | ORAL_TABLET | Freq: Every day | ORAL | Status: DC
  Administered 2015-06-22 – 2015-06-28 (×6): 40 mg via ORAL
  Filled 2015-06-21 (×4): qty 2
  Filled 2015-06-21: qty 1
  Filled 2015-06-21 (×2): qty 2
  Filled 2015-06-21: qty 1

## 2015-06-21 NOTE — Progress Notes (Signed)
Subjective:  Patient known to our practice from outpatient dialysis. She was recently discharged from her dialysis unit due to lack of family support during dialysis treatment. Patient would not get on the machine without her great niece being present there. Her niece is having some family issues due to which she is not able to attend patient's treatment. Potassium level is now corrected. Patient seen during dialysis. Tolerating fair. Asking to get off treatment but continued when reoriented.  Objective:  Vital signs in last 24 hours:  Temp:  [97.8 F (36.6 C)-99.7 F (37.6 C)] 97.8 F (36.6 C) (08/10 0930) Pulse Rate:  [61-81] 77 (08/10 1100) Resp:  [14-23] 23 (08/10 1100) BP: (137-165)/(46-124) 156/124 mmHg (08/10 1100) SpO2:  [90 %-98 %] 97 % (08/10 0930) Weight:  [64.093 kg (141 lb 4.8 oz)-67.7 kg (149 lb 4 oz)] 67.7 kg (149 lb 4 oz) (08/10 0930)  Weight change: -0.274 kg (-9.7 oz) Filed Weights   06/20/15 2254 06/21/15 0500 06/21/15 0930  Weight: 64.184 kg (141 lb 8 oz) 64.093 kg (141 lb 4.8 oz) 67.7 kg (149 lb 4 oz)    Intake/Output: I/O last 3 completed shifts: In: 483 [P.O.:480; I.V.:3] Out: 1000 [Other:1000]     Physical Exam: General:  laying in the bed, lethargic   HEENT  anicteric sclera, moist mucous membranes   Neck  supple, no masses   Pulm/lungs  clear to auscultation anteriorly and laterally   CVS/Heart  prominent systolic murmur, regular rhythm   Abdomen:   soft, nontender, nondistended   Extremities:  no peripheral edema   Neurologic:  lethargic but arousable. Baseline dementia.   Skin:  no acute rashes   Access:  PermCath        Basic Metabolic Panel:  Recent Labs Lab 06/20/15 0058 06/20/15 0541  NA 138 139  K 6.0* 4.9  CL 99* 99*  CO2 17* 20*  GLUCOSE 113* 89  BUN 97* 120*  CREATININE 16.86* 17.32*  CALCIUM 8.7* 9.1     CBC:  Recent Labs Lab 06/20/15 0058  WBC 9.0  HGB 10.1*  HCT 31.3*  MCV 89.1  PLT 172       Microbiology: Results for orders placed or performed during the hospital encounter of 06/19/15  MRSA PCR Screening     Status: None   Collection Time: 06/20/15 11:30 AM  Result Value Ref Range Status   MRSA by PCR NEGATIVE NEGATIVE Final    Comment:        The GeneXpert MRSA Assay (FDA approved for NASAL specimens only), is one component of a comprehensive MRSA colonization surveillance program. It is not intended to diagnose MRSA infection nor to guide or monitor treatment for MRSA infections.     Coagulation Studies: No results for input(s): LABPROT, INR in the last 72 hours.  Urinalysis: No results for input(s): COLORURINE, LABSPEC, PHURINE, GLUCOSEU, HGBUR, BILIRUBINUR, KETONESUR, PROTEINUR, UROBILINOGEN, NITRITE, LEUKOCYTESUR in the last 72 hours.  Invalid input(s): APPERANCEUR    Imaging: Dg Chest Portable 1 View  06/20/2015   CLINICAL DATA:  Acute onset of chest, back and generalized body pain. Difficulty taking a deep breath. Initial encounter.  EXAM: PORTABLE CHEST - 1 VIEW  COMPARISON:  Chest radiograph performed 10/20/2014  FINDINGS: The lungs are well-aerated. Vascular congestion is noted. Left basilar airspace opacity may reflect mild interstitial edema or possibly pneumonia. There is no evidence of pleural effusion or pneumothorax.  The cardiomediastinal silhouette is borderline enlarged. A left-sided dual-lumen catheter is noted ending about the  cavoatrial junction. No acute osseous abnormalities are seen.  IMPRESSION: Vascular congestion and borderline cardiomegaly. Left basilar airspace opacity may reflect mild interstitial edema or possibly pneumonia.   Electronically Signed   By: Roanna Raider M.D.   On: 06/20/2015 01:09     Medications:     . amLODipine  10 mg Oral Daily  . aspirin EC  81 mg Oral Daily  . clopidogrel  75 mg Oral Daily  . donepezil  5 mg Oral QHS  . heparin  5,000 Units Subcutaneous 3 times per day  . insulin aspart  0-15 Units  Subcutaneous TID WC  . isosorbide mononitrate  120 mg Oral Daily  . lisinopril  10 mg Oral Daily  . metoprolol tartrate  25 mg Oral BID  . polyethylene glycol  17 g Oral Daily  . sertraline  50 mg Oral Daily  . sevelamer carbonate  2,400 mg Oral TID WC  . sodium chloride  3 mL Intravenous Q12H  . vitamin B-12  1,000 mcg Oral Daily   acetaminophen **OR** acetaminophen, LORazepam, ondansetron **OR** ondansetron (ZOFRAN) IV  Assessment/ Plan:  81 y.o. female with end-stage renal disease, hypertension, dementia, diabetes  1. End-stage renal disease with Hyperkalemia. Patient is not currently enrolled in chronic dialysis program because of lack of family support. Patient would not get on the machine without her great niece being present there.  She would cause disruption in the treatment of other dialysis patient is therefore she was discharged from the outpatient dialysis unit. Patient's great niece has some family issues due to which she is not able to sit with the patient.  Socially, it is a very difficult situation. Currently, patient is uremic and has high potassium We are going to dialyze her daily for the next few days. Patient will need some kind of chemical restraint to be able to get through dialysis treatment without agitation  2. Anemia of chronic kidney disease. Hemoglobin 10.1. - We will prescribe Procrit during dialysis treatment  3. Secondary hyperparathyroidism - Sevelamer 2400 mg 3 times a day with meals   LOS: 1 [place] 8/10/201611:33 AM

## 2015-06-21 NOTE — Progress Notes (Signed)
PRE HD   

## 2015-06-21 NOTE — Progress Notes (Signed)
    Echo reviewed with Dr. Mariah Milling that showed moderate to severe mitral stenosis, quite possibly on the severe side. 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 There is also some mild aortic stenosis. There was moderately elevated right sided pressure at 50 mm Hg. This coupled with her mitral valve disease could have played a role in her chest pain. With her history of missed dialysis her right sided pressures will tend to be on the high side. To manage her right sided pressures long term, she will need to be compliant with her dialysis. Given her underlying comorbidities she is not currently an invasive candidate. Would manage medically at this time. Should her symptoms return soul look into comfort care.    Eula Listen, PA-C 06/21/2015 9:47 AM

## 2015-06-21 NOTE — Clinical Social Work Note (Signed)
Clinical Social Work Assessment  Patient Details  Name: Caroline Flores MRN: [id number] Date of Birth: 06-06-1920  Date of referral:  06/21/15               Reason for consult:  Facility Placement (pt is from [facility])                Permission sought to share information with:  Family Supports, Magazine features editor Permission granted to share information::  No (pt is not oriented at baseline)  Name::     Maryelizabeth Kaufmann is pt's legal guardian.  Agency::  [facility]  Relationship::     Contact Information:  Guardian's # [phone]  Housing/Transportation Living arrangements for the past 2 months:  Skilled Nursing Facility Source of Information:  Facility, Medical Team Patient Interpreter Needed:  None Criminal Activity/Legal Involvement Pertinent to Current Situation/Hospitalization:  No - Comment as needed Significant Relationships:  Other Family Members (Niece) Lives with:  Facility Resident Do you feel safe going back to the place where you live?    Need for family participation in patient care:  Yes (Comment)  Care giving concerns:  Current Concerns are pt dialysis schedule.  Dialysis Liason is currently looking into her schedule and compliance with treatment.  Will with family and Selena Batten for further information.     Social Worker assessment / plan:  CSW is currently trying to work with facility, and dialysis coordinator on DC plan for pt.  Currently pt in not allowed to go back to center unless she has a sitter because pt would become disruptive at the dialysis center, or would refuse dialysis altogether.  This role of sitter was previously filled by her niece and guardian, she can no longer sit with pt for dialysis because she currently has temporary custody of her grandchildren (3 all under age 46).  Guardian has looked into private sitters but she cannot afford this.  Guardian has also looked into getting insurance to pay for a sitter but this has also been  unsuccessful as medicaid and medicare do not cover sitter costs.  Current arrangements for dialysis per facility are to bring pt to [facility] when she is in need of dialysis since she cannot receive dialysis at center without a sitter.  CSW has asked RN to place palliative consult in for pt's guardian to discuss what they would like to do about dialysis treatment.  Pt's guardian is currently trying to look for another solution for finding a sitter so that pt can continue her dialysis.  CSW will continue to follow.  Employment status:  Retired, Disabled (Comment on whether or not currently receiving Disability) Insurance information:  Medicare PT Recommendations:    Information / Referral to community resources:     Patient/Family's Response to care:  Pt's guardian understands that pt may DC back to [facility] before sitter can be found.  She is in agreement with this.  Patient/Family's Understanding of and Emotional Response to Diagnosis, Current Treatment, and Prognosis:  Pt's guardian understands that pt may DC back to [facility] before sitter can be found.  She is in agreement with this.   Emotional Assessment Appearance:  Appears stated age Attitude/Demeanor/Rapport:  Unable to Assess Affect (typically observed):  Unable to Assess Orientation:  Oriented to Self Alcohol / Substance use:  Never Used Psych involvement (Current and /or in the community):  No (Comment)  Discharge Needs  Concerns to be addressed:  Care Coordination,  Mental Health Concerns, Compliance Issues Concerns Readmission within the last 30 days:  No Current discharge risk:  Physical Impairment, Psychiatric Illness Barriers to Discharge:  Continued Medical Work up   [employer], LCSW 06/21/2015, 3:41 PM

## 2015-06-21 NOTE — Progress Notes (Signed)
Assessment completed. SL rt ac flushes well. Lt chest HD catheter inplace, dressing dry and intact. Pt disoriented, reoriented as needed.  CB in reach, SR up x 3, bed alarm on.

## 2015-06-21 NOTE — Progress Notes (Signed)
TO HD via bed.

## 2015-06-21 NOTE — Progress Notes (Signed)
HD START 

## 2015-06-21 NOTE — Progress Notes (Signed)
[facility] [place] [facility]   PATIENT NAME: Caroline Flores    MR#:  [id number]  DATE OF BIRTH:  [birth year]  SUBJECTIVE:  CHIEF COMPLAINT:   Chief Complaint  Patient presents with  . Generalized Body Aches   Says she feels cold. Forgot she is in the hospital.  REVIEW OF SYSTEMS:  Pt is not able to tell. Confused. Negative for all ROS  Review of Systems  Constitutional: Negative for fever and chills.  HENT: Negative for sore throat.   Eyes: Negative for blurred vision, double vision and pain.  Respiratory: Negative for cough, hemoptysis, shortness of breath and wheezing.   Cardiovascular: Negative for chest pain, palpitations, orthopnea and leg swelling.  Gastrointestinal: Negative for heartburn, nausea, vomiting, abdominal pain, diarrhea and constipation.  Genitourinary: Negative for dysuria and hematuria.  Musculoskeletal: Negative for back pain and joint pain.  Skin: Negative for rash.  Neurological: Negative for sensory change, speech change, focal weakness and headaches.  Endo/Heme/Allergies: Does not bruise/bleed easily.  Psychiatric/Behavioral: Negative for depression. The patient is not nervous/anxious.     DRUG ALLERGIES:  No Known Allergies  VITALS:  Blood pressure 188/73, pulse 71, temperature 97.8 F (36.6 C), temperature source Oral, resp. rate 16, height 5\' 4"  (1.626 m), weight 67.7 kg (149 lb 4 oz), SpO2 97 %.  PHYSICAL EXAMINATION:  GENERAL:  81 y.o.-year-old patient lying in the bed with no acute distress.  EYES: Pupils equal, round, reactive to light and accommodation. No scleral icterus. Extraocular muscles intact.  HEENT: Head atraumatic, normocephalic. Oropharynx and nasopharynx clear.  NECK:  Supple, no jugular venous distention. No thyroid enlargement, no tenderness.  LUNGS: Normal breath sounds bilaterally, no wheezing, positive crepitation. No use of accessory muscles of respiration.  CARDIOVASCULAR: S1, S2 normal.  No murmurs, rubs, or gallops.  ABDOMEN: Soft, nontender, nondistended. Bowel sounds present. No organomegaly or mass.  EXTREMITIES: No pedal edema, cyanosis, or clubbing.  NEUROLOGIC: Cranial nerves II through XII are intact. Muscle strength 3-4 /5 in all extremities. Not able to check further- as she is confused and not very co-operative. PSYCHIATRIC: The patient is confused and somewhat agitated. SKIN: No obvious rash, lesion, or ulcer.   Physical Exam LABORATORY PANEL:   CBC  Recent Labs Lab 06/20/15 0058  WBC 9.0  HGB 10.1*  HCT 31.3*  PLT 172   ------------------------------------------------------------------------------------------------------------------  Chemistries   Recent Labs Lab 06/20/15 0058 06/20/15 0541  NA 138 139  K 6.0* 4.9  CL 99* 99*  CO2 17* 20*  GLUCOSE 113* 89  BUN 97* 120*  CREATININE 16.86* 17.32*  CALCIUM 8.7* 9.1  AST 18  --   ALT 10*  --   ALKPHOS 50  --   BILITOT 0.8  --    ------------------------------------------------------------------------------------------------------------------  Cardiac Enzymes  Recent Labs Lab 06/20/15 0541 06/20/15 1739  TROPONINI 0.08* 0.09*   ------------------------------------------------------------------------------------------------------------------  RADIOLOGY:  Dg Chest Portable 1 View  06/20/2015   CLINICAL DATA:  Acute onset of chest, back and generalized body pain. Difficulty taking a deep breath. Initial encounter.  EXAM: PORTABLE CHEST - 1 VIEW  COMPARISON:  Chest radiograph performed 10/20/2014  FINDINGS: The lungs are well-aerated. Vascular congestion is noted. Left basilar airspace opacity may reflect mild interstitial edema or possibly pneumonia. There is no evidence of pleural effusion or pneumothorax.  The cardiomediastinal silhouette is borderline enlarged. A left-sided dual-lumen catheter is noted ending about the cavoatrial junction. No acute osseous abnormalities are seen.   IMPRESSION: Vascular congestion and  borderline cardiomegaly. Left basilar airspace opacity may reflect mild interstitial edema or possibly pneumonia.   Electronically Signed   By: Roanna Raider M.D.   On: 06/20/2015 01:09    ASSESSMENT AND PLAN:    * End-stage renal disease on hemodialysis-Tuesday/Thursday/Saturday. Missed HD all last week cause she refused it. Patient unable to make own decisions   Nephrology consult to help with HD here. Discussed with Dr. Thedore Mins.  * Hyperkalemia secondary to end-stage renal disease.  Resolved  * Elevated troponin is minimal in setting of ESRD. Insignificant  * uremic encephalopathy - Resolved.  * Dementia Start risperidone on HD days in AM.  * Hypertension, stable on home medications. * Diabetes mellitus type 2 stable clinically.  All the records are reviewed and case discussed with Care Management/Social Workerr. Management plans discussed with the patient, family and they are in agreement.  CODE STATUS: full.  TOTAL TIME TAKING CARE OF THIS PATIENT:35 minutes.   POSSIBLE D/C IN tomorrow, DEPENDING ON CLINICAL CONDITION.   Milagros Loll R M.D on 06/21/2015   Between 7am to 6pm - Pager - [phone]  After 6pm go to [email or web address] - password [email or web address]  [facility]  Office  [phone]  CC: Primary care physician; Pcp Not In System

## 2015-06-21 NOTE — Progress Notes (Signed)
Pt returned from HD.  Remains pleasantly confused.  VSS stable.  Weight 66.3kg via bed.  No distress.  Denies need. CB in reach, SR up x 3, bed alarm on.

## 2015-06-21 NOTE — Clinical Social Work Note (Signed)
CSW attempted to speak to pt.  She was not oriented at all and could barely give CSW her name.  CSW will attempt assessment later.

## 2015-06-21 NOTE — Progress Notes (Addendum)
Initial Nutrition Assessment   INTERVENTION:   Meals and Snacks: Cater to patient preferences; may benefit from liberalizing diet if po intake does not improve Medical Food Supplement Therapy: recommend addition of Nepro daily  NUTRITION DIAGNOSIS:   Inadequate oral intake related to poor appetite as evidenced by meal completion < 25%.  GOAL:   Patient will meet greater than or equal to 90% of their needs  MONITOR:    (Energy Intake, Anthropometrics, Digestive System, Electrolyte/Renal Profile)  REASON FOR ASSESSMENT:   Diagnosis (Renal Diet)    ASSESSMENT:    Pt admitted with chest pain, hyperkalemia; per MD note, pt did receive dialysis for a week, pt was "discharged" from dialysis clinic due to lack of family support during dialysis treatments (pt would not get on dialysis without family bring present); pt with underlying dementia. Able to speak with pt briefly this AM before pt was taken down for dialysis  Past Medical History  Diagnosis Date  . HTN (hypertension)   . Diabetes mellitus, type II   . Hearing loss   . Memory loss   . Chronic kidney disease (CKD), stage V     GFR 2  . Dementia      Diet Order:  Diet renal/carb modified with fluid restriction Diet-HS Snack?: Nothing; Room service appropriate?: Yes; Fluid consistency:: Thin   Energy Intake: pt had barely eaten anything on breakfast tray before going to dialysis this AM, pt was actually sound asleep upon visit. Recorded po intake 20% of meals on average  Food and Nutrition Related History: unable to assess  Electrolyte and Renal Profile:  Recent Labs Lab 06/20/15 0058 06/20/15 0541  BUN 97* 120*  CREATININE 16.86* 17.32*  NA 138 139  K 6.0* 4.9   Glucose Profile:  Recent Labs  06/20/15 2046 06/21/15 0736 06/21/15 1331  GLUCAP 73 87 100*   Protein Profile:  Recent Labs Lab 06/20/15 0058  ALBUMIN 3.2*   Meds: ss novolog, renvela  Nutrition Focused Physical Exam: unable to complete  as pt going for dialysis on visit today  Skin:  Reviewed, no issues  Last BM:  8/9  Height:   Ht Readings from Last 1 Encounters:  06/20/15  (1.626 m)    Weight: weight appears relatively stable over past 2 years per weight encounters  Wt Readings from Last 1 Encounters:  06/21/15 146 lb 2.6 oz (66.3 kg)    Filed Weights   06/21/15 0930 06/21/15 1300 06/21/15 1326  Weight: 149 lb 4 oz (67.7 kg) 145 lb 15.1 oz (66.2 kg) 146 lb 2.6 oz (66.3 kg)   Wt Readings from Last 10 Encounters:  06/21/15 146 lb 2.6 oz (66.3 kg)  09/28/13 143 lb (64.864 kg)    BMI:  Body mass index is 25.08 kg/(m^2).  Estimated Nutritional Needs:   Kcal:  1480-1615 kcals (BEE 1035, 1.3 AF, 1.1-1.2 IF)   Protein:  77-90 g (1.2-1.4 g/kg)   Fluid:  1000 mL plus UOP  MODERATE Care Level  [facility] MS, RD, LDN [phone] Pager

## 2015-06-22 LAB — BASIC METABOLIC PANEL
ANION GAP: 13 (ref 5–15)
BUN: 37 mg/dL — ABNORMAL HIGH (ref 6–20)
CHLORIDE: 98 mmol/L — AB (ref 101–111)
CO2: 30 mmol/L (ref 22–32)
CREATININE: 7.14 mg/dL — AB (ref 0.44–1.00)
Calcium: 8.3 mg/dL — ABNORMAL LOW (ref 8.9–10.3)
GFR calc non Af Amer: 4 mL/min — ABNORMAL LOW (ref >60)
GFR, EST AFRICAN AMERICAN: 5 mL/min — AB (ref >60)
Glucose, Bld: 92 mg/dL (ref 65–99)
Potassium: 3.4 mmol/L — ABNORMAL LOW (ref 3.5–5.1)
SODIUM: 141 mmol/L (ref 135–145)

## 2015-06-22 LAB — GLUCOSE, CAPILLARY
GLUCOSE-CAPILLARY: 97 mg/dL (ref 65–99)
GLUCOSE-CAPILLARY: 141 mg/dL — AB (ref 65–99)
GLUCOSE-CAPILLARY: 128 mg/dL — AB (ref 65–99)
Glucose-Capillary: 158 mg/dL — ABNORMAL HIGH (ref 65–99)

## 2015-06-22 MED ORDER — DIAZEPAM 5 MG PO TABS
5.0000 mg | ORAL_TABLET | Freq: Every day | ORAL | Status: DC | PRN
  Filled 2015-06-22: qty 1

## 2015-06-22 MED ORDER — METOPROLOL TARTRATE 50 MG PO TABS: 50.0000 mg | ORAL_TABLET | Freq: Two times a day (BID) | ORAL | Status: DC

## 2015-06-22 MED ORDER — HYDRALAZINE HCL 25 MG PO TABS: 25.0000 mg | ORAL_TABLET | Freq: Three times a day (TID) | ORAL | Status: DC

## 2015-06-22 MED ORDER — METOPROLOL TARTRATE 50 MG PO TABS
50.0000 mg | ORAL_TABLET | Freq: Two times a day (BID) | ORAL | Status: DC
  Administered 2015-06-22 – 2015-06-28 (×9): 50 mg via ORAL
  Filled 2015-06-22 (×11): qty 1

## 2015-06-22 MED ORDER — HYDRALAZINE HCL 25 MG PO TABS
25.0000 mg | ORAL_TABLET | Freq: Three times a day (TID) | ORAL | Status: DC
  Administered 2015-06-22 – 2015-06-23 (×3): 25 mg via ORAL
  Filled 2015-06-22 (×3): qty 1

## 2015-06-22 MED ORDER — RISPERIDONE 3 MG PO TABS: 3.0000 mg | ORAL_TABLET | ORAL | Status: DC

## 2015-06-22 MED ORDER — LISINOPRIL 40 MG PO TABS: 40.0000 mg | ORAL_TABLET | Freq: Every day | ORAL | Status: DC

## 2015-06-22 NOTE — Progress Notes (Signed)
Spoke with case management to evaluate status on bed placement. However per case management bed is currently not available. Called and spoke with dr. Elpidio Anis to make aware of not beds available at this time. Per md discontinue order for discharge [employer] [employer] [facility]

## 2015-06-22 NOTE — Progress Notes (Signed)
Subjective:  Patient known to our practice from outpatient dialysis. She was recently discharged from her dialysis unit due to lack of family support during dialysis treatment. Patient would not get on the machine without her great niece being present there. Her niece is having some family issues due to which she is not able to attend patient's treatment. Potassium level is now corrected. Patient's clinical condition remains the same. Patient does not remember any events from yesterday. She does not remember seeing me before.   Objective:  Vital signs in last 24 hours:  Temp:  [98.6 F (37 C)-99 F (37.2 C)] 98.6 F (37 C) (08/11 1117) Pulse Rate:  [69-82] 69 (08/11 1117) Resp:  [18-22] 19 (08/11 1117) BP: (152-173)/(53-68) 152/68 mmHg (08/11 1117) SpO2:  [93 %-100 %] 93 % (08/11 1117) Weight:  [63.776 kg (140 lb 9.6 oz)] 63.776 kg (140 lb 9.6 oz) (08/11 0432)  Weight change: 3.2 kg (7 lb 0.9 oz) Filed Weights   06/21/15 1300 06/21/15 1326 06/22/15 0432  Weight: 66.2 kg (145 lb 15.1 oz) 66.3 kg (146 lb 2.6 oz) 63.776 kg (140 lb 9.6 oz)    Intake/Output: I/O last 3 completed shifts: In: 126 [P.O.:120; I.V.:6] Out: 2000 [Other:2000]     Physical Exam: General:  laying in the bed, lethargic   HEENT  anicteric sclera, moist mucous membranes   Neck  supple, no masses   Pulm/lungs  clear to auscultation anteriorly and laterally   CVS/Heart  prominent systolic murmur, regular rhythm   Abdomen:   soft, nontender, nondistended   Extremities:  no peripheral edema   Neurologic:  lethargic but arousable. Baseline dementia.   Skin:  no acute rashes   Access:  PermCath        Basic Metabolic Panel:  Recent Labs Lab 06/20/15 0058 06/20/15 0541 06/21/15 1619 06/22/15 0428  NA 138 139 138 141  K 6.0* 4.9 3.6 3.4*  CL 99* 99* 93* 98*  CO2 17* 20* 25 30  GLUCOSE 113* 89 86 92  BUN 97* 120* 75* 37*  CREATININE 16.86* 17.32* 12.07* 7.14*  CALCIUM 8.7* 9.1 8.1* 8.3*  PHOS   --   --  6.1*  --      CBC:  Recent Labs Lab 06/20/15 0058  WBC 9.0  HGB 10.1*  HCT 31.3*  MCV 89.1  PLT 172      Microbiology: Results for orders placed or performed during the hospital encounter of 06/19/15  MRSA PCR Screening     Status: None   Collection Time: 06/20/15 11:30 AM  Result Value Ref Range Status   MRSA by PCR NEGATIVE NEGATIVE Final    Comment:        The GeneXpert MRSA Assay (FDA approved for NASAL specimens only), is one component of a comprehensive MRSA colonization surveillance program. It is not intended to diagnose MRSA infection nor to guide or monitor treatment for MRSA infections.     Coagulation Studies: No results for input(s): LABPROT, INR in the last 72 hours.  Urinalysis: No results for input(s): COLORURINE, LABSPEC, PHURINE, GLUCOSEU, HGBUR, BILIRUBINUR, KETONESUR, PROTEINUR, UROBILINOGEN, NITRITE, LEUKOCYTESUR in the last 72 hours.  Invalid input(s): APPERANCEUR    Imaging: No results found.   Medications:     . amLODipine  10 mg Oral Daily  . aspirin EC  81 mg Oral Daily  . clopidogrel  75 mg Oral Daily  . donepezil  5 mg Oral QHS  . epoetin (EPOGEN/PROCRIT) injection  4,000 Units Intravenous Q M,W,F-HD  .  feeding supplement (NEPRO CARB STEADY)  237 mL Oral Q1500  . heparin  5,000 Units Subcutaneous 3 times per day  . hydrALAZINE  25 mg Oral 3 times per day  . insulin aspart  0-15 Units Subcutaneous TID WC  . isosorbide mononitrate  120 mg Oral Daily  . lisinopril  40 mg Oral Daily  . metoprolol tartrate  50 mg Oral BID  . polyethylene glycol  17 g Oral Daily  . risperiDONE  3 mg Oral Daily  . sertraline  50 mg Oral Daily  . sevelamer carbonate  2,400 mg Oral TID WC  . sodium chloride  3 mL Intravenous Q12H  . vitamin B-12  1,000 mcg Oral Daily   acetaminophen **OR** acetaminophen, LORazepam, ondansetron **OR** ondansetron (ZOFRAN) IV  Assessment/ Plan:  81 y.o. female with end-stage renal disease,  hypertension, dementia, diabetes  1. End-stage renal disease with Hyperkalemia. Patient is not currently enrolled in chronic dialysis program because of lack of family support. Patient would not get on the machine without her great niece being present there.  She would cause disruption in the treatment of other dialysis patient is therefore she was discharged from the outpatient dialysis unit. Patient's great niece has some family issues due to which she is not able to sit with the patient.  Socially, it is a very difficult situation. Patient will need some kind of chemical restraint to be able to get through dialysis treatment without agitation At present, she is not accepted to the outpatient dialysis unit unless she is able to demonstrate that she can sit through the treatment safely.  Discussed with primary team.  2. Anemia of chronic kidney disease. Hemoglobin 10.1. - We will prescribe Procrit during dialysis treatment  3. Secondary hyperparathyroidism - Sevelamer 2400 mg 3 times a day with meals   LOS: 2 Caroline Flores 8/11/20163:29 PM

## 2015-06-22 NOTE — Discharge Summary (Signed)
[facility] [place] at [facility]   PATIENT NAME: Caroline Flores    MR#:  [id number]  DATE OF BIRTH:  Dec 18, 1919  DATE OF ADMISSION:  06/19/2015 ADMITTING PHYSICIAN: Crissie Figures, MD  DATE OF DISCHARGE: No discharge date for patient encounter.  PRIMARY CARE PHYSICIAN: Pcp Not In System    ADMISSION DIAGNOSIS:  Hyperkalemia [E87.5] Elevated troponin [R79.89] Chest pain, unspecified chest pain type [R07.9]  DISCHARGE DIAGNOSIS:  Principal Problem:   Chest pain Active Problems:   Elevated troponin   ESRD on hemodialysis   Hyperkalemia   Dementia   HTN (hypertension)   DM (diabetes mellitus)   Diabetes mellitus, type II   Chronic kidney disease (CKD), stage V   SECONDARY DIAGNOSIS:   Past Medical History  Diagnosis Date  . HTN (hypertension)   . Diabetes mellitus, type II   . Hearing loss   . Memory loss   . Chronic kidney disease (CKD), stage V     GFR 2  . Dementia      ADMITTING HISTORY  Caroline Flores is a 81 y.o. female with a known history of end-stage renal disease on hemodialysis-Tuesday/Thursday/Saturday, hypertension, diabetes mellitus, dementia, nursing home resident brought into the emergency room with the complaints of vague chest pain and body aches. According to the patient's grand niece who is with the patient at this time, patient did not have her hemodialysis all last week since no attendant available to go along with the patient and last night nursing home staff mentioned that the patient complained of body aches and chest pain, hence brought to the emergency room. Patient was evaluated by the ED physician and was found to be with stable vitals, comfortably resting with no complaints. Workup revealed elevated progression of 6.0, BUN/creatinine of 97/16.8, troponin 0.09. EKG sinus bradycardia with ventricular rate of 52 bpm, no acute ischemic changes. Chest x-ray vascular congestion. Patient was given soda bicarbonate,  insulin and dextrose, calcium gluconate and Kayexalate and hospitalist service was consulted for further management. Patient is comfortably resting in the bed at this time and denies any complaints such chest pain, shortness of breath, body aches, nausea, vomiting, diarrhea, abdominal pain. Patient is alert awake and oriented to self and is at her baseline mental status because of her dementia per patient's grandniece.   HOSPITAL COURSE:   * End-stage renal disease on hemodialysis-Tuesday/Thursday/Saturday. Missed HD all last week cause she refused it. Patient unable to make own decisions  Nephrology consult to help with HD here. Discussed with Dr. Thedore Mins.  * Hyperkalemia secondary to end-stage renal disease.  Resolved  * Elevated troponin is minimal in setting of ESRD. Insignificant  * uremic encephalopathy - Resolved.  * Dementia Start risperidone on HD days in AM.  * Hypertension, stable on home medications.  * Diabetes mellitus type 2 stable clinically.  Patient has been calm during her hospital stay. Has had 2 rounds of dialysis without any issues. Patient does have severe dementia and can get agitated at times. For this Added Risperdal in the morning of her dialysis days. Patient is stable to be discharged back to skilled nursing facility.   CONSULTS OBTAINED:  Treatment Team:  Antonieta Iba, MD Mosetta Pigeon, MD Suan Halter, MD Audery Amel, MD  DRUG ALLERGIES:  No Known Allergies  DISCHARGE MEDICATIONS:   Current Discharge Medication List    START taking these medications   Details  hydrALAZINE (APRESOLINE) 25 MG tablet Take 1 tablet (25 mg total)  by mouth every 8 (eight) hours. Qty: 90 tablet, Refills: 0    risperiDONE (RISPERDAL) 3 MG tablet Take 1 tablet (3 mg total) by mouth 3 (three) times a week. Qty: 15 tablet, Refills: 0      CONTINUE these medications which have CHANGED   Details  lisinopril (PRINIVIL,ZESTRIL) 40 MG tablet Take 1  tablet (40 mg total) by mouth daily. Qty: 30 tablet, Refills: 0    metoprolol (LOPRESSOR) 50 MG tablet Take 1 tablet (50 mg total) by mouth 2 (two) times daily. Qty: 60 tablet, Refills: 0      CONTINUE these medications which have NOT CHANGED   Details  acetaminophen (TYLENOL) 500 MG tablet Take 1,000 mg by mouth 2 (two) times daily.    amLODipine (NORVASC) 10 MG tablet Take 10 mg by mouth daily.    clopidogrel (PLAVIX) 75 MG tablet Take 75 mg by mouth daily.    Cyanocobalamin (VITAMIN B12) 1000 MCG TBCR Take 1 tablet by mouth daily.    donepezil (ARICEPT) 5 MG tablet Take 5 mg by mouth at bedtime.    isosorbide mononitrate (IMDUR) 30 MG 24 hr tablet Take 120 mg by mouth daily.     !! LORazepam (ATIVAN) 2 MG/ML injection Inject 1 mg into the vein once a week. Every Saturday for dialysis    !! LORazepam (ATIVAN) 2 MG/ML injection Inject 0.5 mg into the vein once a week. Prior to dialysis on Tues and Thurs    polyethylene glycol (MIRALAX / GLYCOLAX) packet Take 17 g by mouth daily.    sertraline (ZOLOFT) 50 MG tablet Take 50 mg by mouth daily.    sevelamer carbonate (RENVELA) 800 MG tablet Take 2,400 mg by mouth 3 (three) times daily with meals.    aspirin 81 MG tablet Take 81 mg by mouth daily.     !! - Potential duplicate medications found. Please discuss with provider.       Today    VITAL SIGNS:  Blood pressure 152/68, pulse 69, temperature 98.6 F (37 C), temperature source Oral, resp. rate 19, height 5\' 4"  (1.626 m), weight 63.776 kg (140 lb 9.6 oz), SpO2 93 %.  I/O:   Intake/Output Summary (Last 24 hours) at 06/22/15 1149 Last data filed at 06/22/15 1121  Gross per 24 hour  Intake    243 ml  Output   1000 ml  Net   -757 ml    PHYSICAL EXAMINATION:  Physical Exam  GENERAL:  81 y.o.-year-old patient lying in the bed with no acute distress.  LUNGS: Normal breath sounds bilaterally, no wheezing, rales,rhonchi or crepitation. No use of accessory muscles of  respiration.  CARDIOVASCULAR: S1, S2 normal. No murmurs, rubs, or gallops.  ABDOMEN: Soft, non-tender, non-distended. Bowel sounds present. No organomegaly or mass.  NEUROLOGIC: Moves all 4 extremities. PSYCHIATRIC: The patient is alert and awake but confused SKIN: No obvious rash, lesion, or ulcer.   DATA REVIEW:   CBC  Recent Labs Lab 06/20/15 0058  WBC 9.0  HGB 10.1*  HCT 31.3*  PLT 172    Chemistries   Recent Labs Lab 06/20/15 0058  06/22/15 0428  NA 138  < > 141  K 6.0*  < > 3.4*  CL 99*  < > 98*  CO2 17*  < > 30  GLUCOSE 113*  < > 92  BUN 97*  < > 37*  CREATININE 16.86*  < > 7.14*  CALCIUM 8.7*  < > 8.3*  AST 18  --   --  ALT 10*  --   --   ALKPHOS 50  --   --   BILITOT 0.8  --   --   < > = values in this interval not displayed.  Cardiac Enzymes  Recent Labs Lab 06/20/15 1739  TROPONINI 0.09*    Microbiology Results  Results for orders placed or performed during the hospital encounter of 06/19/15  MRSA PCR Screening     Status: None   Collection Time: 06/20/15 11:30 AM  Result Value Ref Range Status   MRSA by PCR NEGATIVE NEGATIVE Final    Comment:        The GeneXpert MRSA Assay (FDA approved for NASAL specimens only), is one component of a comprehensive MRSA colonization surveillance program. It is not intended to diagnose MRSA infection nor to guide or monitor treatment for MRSA infections.     RADIOLOGY:  No results found.    Follow up with PCP in 1 week.  Management plans discussed with the patient, family and they are in agreement.  CODE STATUS:     Code Status Orders        Start     Ordered   06/20/15 0523  Full code   Continuous     06/20/15 0522      TOTAL TIME TAKING CARE OF THIS PATIENT ON DAY OF DISCHARGE: more than 30 minutes.    Milagros Loll R M.D on 06/22/2015 at 11:49 AM  Between 7am to 6pm - Pager - [phone]  After 6pm go to [email or web address] - password [email or web address]  [facility]   Office  [phone]  CC: Primary care physician; Pcp Not In System

## 2015-06-22 NOTE — Care Management Important Message (Signed)
Important Message  Patient Details  Name: Caroline Flores MRN: [id number] Date of Birth: 04-28-1920   Medicare Important Message Given:  Yes-second notification given    Olegario Messier A Allmond 06/22/2015, 10:01 AM

## 2015-06-22 NOTE — Progress Notes (Signed)
Patient has no acute event overnight. She remained NSR on the monitor  with stable VS. She denied pain or being in any discomfort. She rested for most of the night, bed was kept low and bed alarm activated and needed items and call bell within reach. Will continue to monitor.

## 2015-06-22 NOTE — Consult Note (Signed)
[place] [facility] Consult   Reason for Consult:  Consult for 81 year old woman with dementia who is a chronic hemodialysis patient. Concern over anxiety and agitation interfering with dialysis Referring Physician:  Manuella Ghazi Patient Identification: SHELITHA MAGLEY MRN:  [id number] Principal Diagnosis: Dementia Diagnosis:   Patient Active Problem List   Diagnosis Date Noted  . Chest pain [R07.9] 06/20/2015  . Elevated troponin [R79.89] 06/20/2015  . ESRD on hemodialysis [N18.6, Z99.2] 06/20/2015  . Hyperkalemia [E87.5] 06/20/2015  . Dementia [F03.90] 06/20/2015  . HTN (hypertension) [I10] 06/20/2015  . DM (diabetes mellitus) [E11.9] 06/20/2015  . Diabetes mellitus, type II [E11.9]   . Chronic kidney disease (CKD), stage V [N18.5]     Total Time spent with patient: 45 minutes  Subjective:   HAWRAA STAMBAUGH is a 81 y.o. female patient admitted with "I want out of here".  HPI:  Information from the chart and the patient. Also discussed the case with Dr. Manuella Ghazi. 81 year old woman currently in the hospital with complaints of chest pain. Concern is raised over whether she is getting her hemodialysis. It appears that one factor that is interfering with that is her behavior and lack of cooperation.patient herself is not able to give much useful history. She does not know where she is or why she is here. She states that she does not know what dialysis is although she recognizes the word. She spends much of the interview complaining about being in the hospital at all and insisting that she is going to go home. It appears that she has been uncooperative with dialysis probably in large part because of her dementia making it impossible for her to understand her situation.  Past psychiatric history: Nothing noted other than a known history of dementia. I did not do a full Mini-Mental status exam. Patient's current dementia however is such that she is able to converse but she is completely  disoriented and her short-term memory is almost nonexistent. She has no idea what year it is or how old she is. She is fixed on the idea that she is going to go home to her house and live with her son. Does not remember that she lives in a nursing home. No known other past psychiatric history identified. Patient denies ever having seen a psychiatrist or been in a psychiatric hospital.  Medical history: Hypertension, diabetes, end-stage renal disease on chronic dialysis  Social history: Patient lives in a nursing home. From what I can see in the chart her grand niece has been the relative most closely involved. Patient tells me that she has a son. I'm not sure if he is actively involved in her care right now.  Substance abuse history: None known. Not relevant to current situation.  Family history: Unknown  Current medications: Most notably Dr.Shah has started her on some standing risk at all during the day. I'm not sure if she has had a dose of that yet. Has when necessary Ativan at a low dose ordered for her dialysis. HPI Elements:   Quality:  agitation and irritability. Severity:  moderate but interfering with crucial medical care. Timing:  ongoing issue pretty constant. Duration:  gets worse unsure when they try to take her to dialysis. Context:  severe dementia.  Past Medical History:  Past Medical History  Diagnosis Date  . HTN (hypertension)   . Diabetes mellitus, type II   . Hearing loss   . Memory loss   . Chronic kidney disease (CKD), stage V  GFR 2  . Dementia     Past Surgical History  Procedure Laterality Date  . Back surgery     Family History: History reviewed. No pertinent family history. Social History:  History  Alcohol Use No     History  Drug Use No    Social History   Social History  . Marital Status: Widowed    Spouse Name: N/A  . Number of Children: N/A  . Years of Education: N/A   Social History Main Topics  . Smoking status: Current Every Day  Smoker  . Smokeless tobacco: Never Used  . Alcohol Use: No  . Drug Use: No  . Sexual Activity: Not Asked   Other Topics Concern  . None   Social History Narrative   Additional Social History:                          Allergies:  No Known Allergies  Labs:  Results for orders placed or performed during the hospital encounter of 06/19/15 (from the past 48 hour(s))  Glucose, capillary     Status: None   Collection Time: 06/20/15  8:46 PM  Result Value Ref Range   Glucose-Capillary 73 65 - 99 mg/dL  Glucose, capillary     Status: None   Collection Time: 06/21/15  7:36 AM  Result Value Ref Range   Glucose-Capillary 87 65 - 99 mg/dL   Comment 1 Notify RN    Comment 2 Document in Chart   Glucose, capillary     Status: Abnormal   Collection Time: 06/21/15  1:31 PM  Result Value Ref Range   Glucose-Capillary 100 (H) 65 - 99 mg/dL  Renal function panel     Status: Abnormal   Collection Time: 06/21/15  4:19 PM  Result Value Ref Range   Sodium 138 135 - 145 mmol/L   Potassium 3.6 3.5 - 5.1 mmol/L   Chloride 93 (L) 101 - 111 mmol/L   CO2 25 22 - 32 mmol/L   Glucose, Bld 86 65 - 99 mg/dL   BUN 75 (H) 6 - 20 mg/dL   Creatinine, Ser 12.07 (H) 0.44 - 1.00 mg/dL   Calcium 8.1 (L) 8.9 - 10.3 mg/dL   Phosphorus 6.1 (H) 2.5 - 4.6 mg/dL   Albumin 2.7 (L) 3.5 - 5.0 g/dL   GFR calc non Af Amer 2 (L) >60 mL/min   GFR calc Af Amer 3 (L) >60 mL/min    Comment: (NOTE) The eGFR has been calculated using the CKD EPI equation. This calculation has not been validated in all clinical situations. eGFR's persistently <60 mL/min signify possible Chronic Kidney Disease.    Anion gap 20 (H) 5 - 15  Glucose, capillary     Status: Abnormal   Collection Time: 06/21/15  4:38 PM  Result Value Ref Range   Glucose-Capillary 182 (H) 65 - 99 mg/dL  Glucose, capillary     Status: Abnormal   Collection Time: 06/21/15  7:17 PM  Result Value Ref Range   Glucose-Capillary 113 (H) 65 - 99 mg/dL    Comment 1 Notify RN   Basic metabolic panel     Status: Abnormal   Collection Time: 06/22/15  4:28 AM  Result Value Ref Range   Sodium 141 135 - 145 mmol/L   Potassium 3.4 (L) 3.5 - 5.1 mmol/L   Chloride 98 (L) 101 - 111 mmol/L   CO2 30 22 - 32 mmol/L   Glucose, Bld 92  65 - 99 mg/dL   BUN 37 (H) 6 - 20 mg/dL   Creatinine, Ser 7.14 (H) 0.44 - 1.00 mg/dL   Calcium 8.3 (L) 8.9 - 10.3 mg/dL   GFR calc non Af Amer 4 (L) >60 mL/min   GFR calc Af Amer 5 (L) >60 mL/min    Comment: (NOTE) The eGFR has been calculated using the CKD EPI equation. This calculation has not been validated in all clinical situations. eGFR's persistently <60 mL/min signify possible Chronic Kidney Disease.    Anion gap 13 5 - 15  Glucose, capillary     Status: None   Collection Time: 06/22/15  7:26 AM  Result Value Ref Range   Glucose-Capillary 97 65 - 99 mg/dL  Glucose, capillary     Status: Abnormal   Collection Time: 06/22/15 11:15 AM  Result Value Ref Range   Glucose-Capillary 141 (H) 65 - 99 mg/dL  Glucose, capillary     Status: Abnormal   Collection Time: 06/22/15  4:35 PM  Result Value Ref Range   Glucose-Capillary 158 (H) 65 - 99 mg/dL   Comment 1 Notify RN     Vitals: Blood pressure 152/68, pulse 69, temperature 98.6 F (37 C), temperature source Oral, resp. rate 19, height 5' 4"  (1.626 m), weight 63.776 kg (140 lb 9.6 oz), SpO2 93 %.  Risk to Self: Is patient at risk for suicide?: No Risk to Others:   Prior Inpatient Therapy:   Prior Outpatient Therapy:    Current Facility-Administered Medications  Medication Dose Route Frequency Provider Last Rate Last Dose  . acetaminophen (TYLENOL) tablet 650 mg  650 mg Oral Q6H PRN Juluis Mire, MD       Or  . acetaminophen (TYLENOL) suppository 650 mg  650 mg Rectal Q6H PRN Juluis Mire, MD      . amLODipine (NORVASC) tablet 10 mg  10 mg Oral Daily Juluis Mire, MD   10 mg at 06/22/15 1027  . aspirin EC tablet 81 mg  81 mg Oral Daily  Juluis Mire, MD   81 mg at 06/22/15 1027  . clopidogrel (PLAVIX) tablet 75 mg  75 mg Oral Daily Juluis Mire, MD   75 mg at 06/22/15 1027  . diazepam (VALIUM) tablet 5 mg  5 mg Oral Daily PRN Gonzella Lex, MD      . donepezil (ARICEPT) tablet 5 mg  5 mg Oral QHS Juluis Mire, MD   5 mg at 06/21/15 2126  . epoetin alfa (EPOGEN,PROCRIT) injection 4,000 Units  4,000 Units Intravenous Q M,W,F-HD Murlean Iba, MD   4,000 Units at 06/21/15 1148  . feeding supplement (NEPRO CARB STEADY) liquid 237 mL  237 mL Oral Q1500 Srikar Sudini, MD 240 mL/hr at 06/21/15 1507 237 mL at 06/22/15 1410  . heparin injection 5,000 Units  5,000 Units Subcutaneous 3 times per day Juluis Mire, MD   5,000 Units at 06/22/15 1406  . hydrALAZINE (APRESOLINE) tablet 25 mg  25 mg Oral 3 times per day Hillary Bow, MD   25 mg at 06/22/15 1406  . insulin aspart (novoLOG) injection 0-15 Units  0-15 Units Subcutaneous TID WC Juluis Mire, MD   3 Units at 06/22/15 1711  . isosorbide mononitrate (IMDUR) 24 hr tablet 120 mg  120 mg Oral Daily Juluis Mire, MD   120 mg at 06/22/15 1027  . lisinopril (PRINIVIL,ZESTRIL) tablet 40 mg  40 mg Oral Daily Hillary Bow, MD   40 mg  at 06/22/15 1156  . LORazepam (ATIVAN) injection 0.5 mg  0.5 mg Intravenous Once PRN Vaughan Basta, MD      . metoprolol (LOPRESSOR) tablet 50 mg  50 mg Oral BID Srikar Sudini, MD      . ondansetron (ZOFRAN) tablet 4 mg  4 mg Oral Q6H PRN Juluis Mire, MD       Or  . ondansetron [facility]) injection 4 mg  4 mg Intravenous Q6H PRN Juluis Mire, MD      . polyethylene glycol (MIRALAX / GLYCOLAX) packet 17 g  17 g Oral Daily Juluis Mire, MD   17 g at 06/22/15 1027  . risperiDONE (RISPERDAL) tablet 3 mg  3 mg Oral Daily Hillary Bow, MD   3 mg at 06/22/15 2355  . sertraline (ZOLOFT) tablet 50 mg  50 mg Oral Daily Juluis Mire, MD   50 mg at 06/22/15 1027  . sevelamer carbonate (RENVELA) tablet 2,400 mg  2,400 mg Oral  TID WC Juluis Mire, MD   2,400 mg at 06/22/15 1711  . sodium chloride 0.9 % injection 3 mL  3 mL Intravenous Q12H Juluis Mire, MD   3 mL at 06/22/15 1030  . vitamin B-12 (CYANOCOBALAMIN) tablet 1,000 mcg  1,000 mcg Oral Daily Juluis Mire, MD   1,000 mcg at 06/22/15 1027    Musculoskeletal: Strength & Muscle Tone: within normal limits Gait & Station: unable to stand Patient leans: N/A  Psychiatric Specialty Exam: Physical Exam  Constitutional: She appears well-developed and well-nourished.  HENT:  Head: Normocephalic and atraumatic.  Eyes: Conjunctivae are normal. Pupils are equal, round, and reactive to light.  Neck: Normal range of motion.  Cardiovascular: Normal heart sounds.   Respiratory: Effort normal.  GI: Soft.  Musculoskeletal: Normal range of motion.  Neurological: She is alert.  Skin: Skin is warm and dry.  Psychiatric: Her speech is normal. Her affect is labile. She is agitated. Thought content is paranoid. Cognition and memory are impaired. She expresses impulsivity. She exhibits abnormal recent memory and abnormal remote memory.  Patient presents as an elderly woman who was wide awake and interactive. Made good eye contact. Speech is easy to understand. Affect is labile and irritable. Patient is disoriented and clearly very impaired in her memory and cognition.    Review of Systems  Constitutional: Negative.   HENT: Negative.   Eyes: Negative.   Respiratory: Negative.   Cardiovascular: Negative.   Gastrointestinal: Negative.   Musculoskeletal: Negative.   Skin: Negative.   Neurological: Negative.   Psychiatric/Behavioral: Negative for depression, suicidal ideas, hallucinations and substance abuse. The patient is nervous/anxious and has insomnia.     Blood pressure 152/68, pulse 69, temperature 98.6 F (37 C), temperature source Oral, resp. rate 19, height 5' 4"  (1.626 m), weight 63.776 kg (140 lb 9.6 oz), SpO2 93 %.Body mass index is 24.12 kg/(m^2).   General Appearance: Casual and Fairly Groomed  Engineer, water::  Good  Speech:  Pressured  Volume:  Increased  Mood:  Irritable  Affect:  Labile  Thought Process:  Circumstantial  Orientation:  Negative  Thought Content:  Negative  Suicidal Thoughts:  No  Homicidal Thoughts:  No  Memory:  Immediate;   Fair Recent;   Poor Remote;   Poor  Judgement:  Impaired  Insight:  Lacking  Psychomotor Activity:  Normal  Concentration:  Poor  Recall:  Poor  Fund of Knowledge:Poor  Language: Poor  Akathisia:  No  Handed:  Right  AIMS (if indicated):     Assets:  Communication Skills Housing Social Support  ADL's:  Intact  Cognition: Impaired,  Moderate and Severe  Sleep:      Medical Decision Making: Review of Psycho-Social Stressors (1), Established Problem, Worsening (2), Review or order medicine tests (1), Review of Medication Regimen & Side Effects (2) and Review of New Medication or Change in Dosage (2)  Treatment Plan Summary: Medication management and Plan 81 year old woman with dementia and dialysis. Gets agitated around dialysis. Not clear whether agitation is a problem at other times in her usual living situation. I will not change the new order for Risperdal. I will be interested to see if she tolerates that and if it may help with her irritability. Or the dialysis however I am going to suggest Valium 5 mg to be given probably just before they prepared to take her to dialysis treatment. Hopefully this will get her sedated enough that she will be compliant with dialysis and last long enough to complete the session. I will follow-up as needed.  Plan:  Patient does not meet criteria for psychiatric inpatient admission. Supportive therapy provided about ongoing stressors. Disposition: follow as needed  Alethia Berthold 06/22/2015 7:01 PM

## 2015-06-22 NOTE — Progress Notes (Signed)
Patient is a long term care resident at [facility]. Per Selena Batten dialysis liaison patient can't go to the outpatient dialysis clinic without a sitter. Clinical Child psychotherapist (CSW) contacted [employer] at [facility]. Per Gavin Pound they can't accept patient back until a dialysis plan is set up. Per CSW note patient can't pay privately for sitter. Per Gavin Pound at [facility] they can't provide a sitter. Palliative and psych consults are ordered. CSW discussed case with Marine scientist. CSW will continue to follow and assist as needed.   Jetta Lout, LCSWA [phone]

## 2015-06-22 NOTE — Progress Notes (Signed)
RN notified

## 2015-06-22 NOTE — Progress Notes (Signed)
[facility] - [place] [facility] [place]   PATIENT NAME: Caroline Flores    MR#:  [id number]  DATE OF BIRTH:  11-Nov-1920  SUBJECTIVE:  CHIEF COMPLAINT:   Chief Complaint  Patient presents with  . Generalized Body Aches   No concerns. No HD today.  REVIEW OF SYSTEMS:  Pt is not able to tell. Confused. Negative for all ROS  Review of Systems  Constitutional: Negative for fever and chills.  HENT: Negative for sore throat.   Eyes: Negative for blurred vision, double vision and pain.  Respiratory: Negative for cough, hemoptysis, shortness of breath and wheezing.   Cardiovascular: Negative for chest pain, palpitations, orthopnea and leg swelling.  Gastrointestinal: Negative for heartburn, nausea, vomiting, abdominal pain, diarrhea and constipation.  Genitourinary: Negative for dysuria and hematuria.  Musculoskeletal: Negative for back pain and joint pain.  Skin: Negative for rash.  Neurological: Negative for sensory change, speech change, focal weakness and headaches.  Endo/Heme/Allergies: Does not bruise/bleed easily.  Psychiatric/Behavioral: Negative for depression. The patient is not nervous/anxious.     DRUG ALLERGIES:  No Known Allergies  VITALS:  Blood pressure 152/68, pulse 69, temperature 98.6 F (37 C), temperature source Oral, resp. rate 19, height 5\' 4"  (1.626 m), weight 63.776 kg (140 lb 9.6 oz), SpO2 93 %.  PHYSICAL EXAMINATION:  GENERAL:  81 y.o.-year-old patient lying in the bed with no acute distress.  EYES: Pupils equal, round, reactive to light and accommodation. No scleral icterus. Extraocular muscles intact.  HEENT: Head atraumatic, normocephalic. Oropharynx and nasopharynx clear.  NECK:  Supple, no jugular venous distention. No thyroid enlargement, no tenderness.  LUNGS: Normal breath sounds bilaterally, no wheezing, positive crepitation. No use of accessory muscles of respiration.  CARDIOVASCULAR: S1, S2 normal. No murmurs, rubs, or  gallops.  ABDOMEN: Soft, nontender, nondistended. Bowel sounds present. No organomegaly or mass.  EXTREMITIES: No pedal edema, cyanosis, or clubbing.  NEUROLOGIC: Cranial nerves II through XII are intact. Muscle strength 3-4 /5 in all extremities. Not able to check further- as she is confused and not very co-operative. PSYCHIATRIC: The patient is confused and somewhat agitated. SKIN: No obvious rash, lesion, or ulcer.   Physical Exam LABORATORY PANEL:   CBC  Recent Labs Lab 06/20/15 0058  WBC 9.0  HGB 10.1*  HCT 31.3*  PLT 172   ------------------------------------------------------------------------------------------------------------------  Chemistries   Recent Labs Lab 06/20/15 0058  06/22/15 0428  NA 138  < > 141  K 6.0*  < > 3.4*  CL 99*  < > 98*  CO2 17*  < > 30  GLUCOSE 113*  < > 92  BUN 97*  < > 37*  CREATININE 16.86*  < > 7.14*  CALCIUM 8.7*  < > 8.3*  AST 18  --   --   ALT 10*  --   --   ALKPHOS 50  --   --   BILITOT 0.8  --   --   < > = values in this interval not displayed. ------------------------------------------------------------------------------------------------------------------  Cardiac Enzymes  Recent Labs Lab 06/20/15 0541 06/20/15 1739  TROPONINI 0.08* 0.09*   ------------------------------------------------------------------------------------------------------------------  RADIOLOGY:  No results found.  ASSESSMENT AND PLAN:    * End-stage renal disease on hemodialysis-Tuesday/Thursday/Saturday. Missed HD all last week cause she refused it. Patient unable to make own decisions   Nephrology consult to help with HD here. Discussed with Dr. Thedore Mins.  * Hyperkalemia secondary to end-stage renal disease.  Resolved  * Elevated troponin is minimal in setting of  ESRD. Insignificant  * uremic encephalopathy - Resolved.  * Dementia Start risperidone on HD days in AM. I also discussed case with Dr. Toni Amend and consulted him for  medication regimen.  * Hypertension, stable on home medications. * Diabetes mellitus type 2 stable clinically.  All the records are reviewed and case discussed with Care Management/Social Workerr. Management plans discussed with the patient, family and they are in agreement.  CODE STATUS: full.  TOTAL TIME TAKING CARE OF THIS PATIENT:35 minutes.   POSSIBLE D/C TOMORROW ONCE BED AT SNF AVAILABLE.   Milagros Loll R M.D on 06/22/2015   Between 7am to 6pm - Pager - [phone]  After 6pm go to [email or web address] - password [email or web address]  [facility]  Office  [phone]  CC: Primary care physician; Pcp Not In System

## 2015-06-23 LAB — BLOOD GAS, ARTERIAL
Acid-Base Excess: 12.4 mmol/L — ABNORMAL HIGH (ref 0.0–3.0)
Bicarbonate: 35.8 mEq/L — ABNORMAL HIGH (ref 21.0–28.0)
FIO2: 0.21
O2 Saturation: 96.5 %
PATIENT TEMPERATURE: 37
PO2 ART: 74 mmHg — AB (ref 83.0–108.0)
pCO2 arterial: 40 mmHg (ref 32.0–48.0)
pH, Arterial: 7.56 — ABNORMAL HIGH (ref 7.350–7.450)

## 2015-06-23 LAB — GLUCOSE, CAPILLARY
Glucose-Capillary: 145 mg/dL — ABNORMAL HIGH (ref 65–99)
Glucose-Capillary: 121 mg/dL — ABNORMAL HIGH (ref 65–99)
Glucose-Capillary: 227 mg/dL — ABNORMAL HIGH (ref 65–99)
Glucose-Capillary: 61 mg/dL — ABNORMAL LOW (ref 65–99)
Glucose-Capillary: 159 mg/dL — ABNORMAL HIGH (ref 65–99)

## 2015-06-23 LAB — RENAL FUNCTION PANEL
ANION GAP: 11 (ref 5–15)
Albumin: 2.7 g/dL — ABNORMAL LOW (ref 3.5–5.0)
BUN: 48 mg/dL — ABNORMAL HIGH (ref 6–20)
CALCIUM: 8.4 mg/dL — AB (ref 8.9–10.3)
CO2: 30 mmol/L (ref 22–32)
Chloride: 98 mmol/L — ABNORMAL LOW (ref 101–111)
Creatinine, Ser: 8.92 mg/dL — ABNORMAL HIGH (ref 0.44–1.00)
GFR, EST AFRICAN AMERICAN: 4 mL/min — AB (ref >60)
GFR, EST NON AFRICAN AMERICAN: 3 mL/min — AB (ref >60)
Glucose, Bld: 81 mg/dL (ref 65–99)
PHOSPHORUS: 4.4 mg/dL (ref 2.5–4.6)
POTASSIUM: 4 mmol/L (ref 3.5–5.1)
Sodium: 139 mmol/L (ref 135–145)

## 2015-06-23 LAB — CBC
HCT: 28.9 % — ABNORMAL LOW (ref 35.0–47.0)
Hemoglobin: 9.3 g/dL — ABNORMAL LOW (ref 12.0–16.0)
MCH: 28.5 pg (ref 26.0–34.0)
MCHC: 32.1 g/dL (ref 32.0–36.0)
MCV: 88.7 fL (ref 80.0–100.0)
PLATELETS: 178 10*3/uL (ref 150–440)
RBC: 3.26 MIL/uL — AB (ref 3.80–5.20)
RDW: 16.1 % — AB (ref 11.5–14.5)
WBC: 6.8 10*3/uL (ref 3.6–11.0)

## 2015-06-23 MED ORDER — INSULIN ASPART 100 UNIT/ML ~~LOC~~ SOLN
0.0000 [IU] | Freq: Every day | SUBCUTANEOUS | Status: DC
  Administered 2015-06-25: 2 [IU] via SUBCUTANEOUS
  Administered 2015-06-27: 3 [IU] via SUBCUTANEOUS
  Filled 2015-06-23: qty 3
  Filled 2015-06-23: qty 2

## 2015-06-23 MED ORDER — INSULIN ASPART 100 UNIT/ML ~~LOC~~ SOLN: 0.0000 [IU] | Freq: Three times a day (TID) | SUBCUTANEOUS | Status: DC

## 2015-06-23 MED ORDER — HYDRALAZINE HCL 50 MG PO TABS
50.0000 mg | ORAL_TABLET | Freq: Three times a day (TID) | ORAL | Status: DC
  Administered 2015-06-23 – 2015-06-24 (×2): 50 mg via ORAL
  Filled 2015-06-23 (×3): qty 1

## 2015-06-23 MED ORDER — HEPARIN SODIUM (PORCINE) 1000 UNIT/ML IJ SOLN
3.4000 mL | Freq: Once | INTRAMUSCULAR | Status: AC
  Administered 2015-06-23: 3400 [IU]

## 2015-06-23 MED ORDER — DEXTROSE 50 % IV SOLN
INTRAVENOUS | Status: AC
  Filled 2015-06-23: qty 50

## 2015-06-23 NOTE — Progress Notes (Signed)
Spoke with Alycia Rossetti, [facility] rep at [phone], to notify of non-emergent EMS transport.  Auth notification reference given as [id number].   Service date range good from 06/23/15 - 09/21/15.   Gap exception requested to determine if services can be considered at an in-network level.

## 2015-06-23 NOTE — Care Management Note (Signed)
I will start the referral process back to [facility] with the anticipation that medications will allow her to resume her outpatient dialysis.  I will wait for final conformation from Dr. Thedore Mins that she is ok to go back before I have a final placement chair slot. Ivor Reining  Dialysis Liaison  [phone]

## 2015-06-23 NOTE — Progress Notes (Signed)
Patient presently at dialysis at this time.

## 2015-06-23 NOTE — Progress Notes (Signed)
Subjective:  Patient known to our practice from outpatient dialysis. She was recently discharged from her dialysis unit due to lack of family support during dialysis treatment. Patient would not get on the machine without her great niece being present there. Her niece is having some family issues due to which she is not able to attend patient's treatment.  Clinically, remains the same Has developed some dependent edema  Objective:  Vital signs in last 24 hours:  Temp:  [97.3 F (36.3 C)-99.8 F (37.7 C)] 97.3 F (36.3 C) (08/12 1137) Pulse Rate:  [73-77] 77 (08/12 1137) Resp:  [18-19] 18 (08/12 1137) BP: (144-177)/(58-75) 144/58 mmHg (08/12 1137) SpO2:  [94 %-97 %] 94 % (08/12 1137) Weight:  [64.139 kg (141 lb 6.4 oz)] 64.139 kg (141 lb 6.4 oz) (08/12 0338)  Weight change: -3.561 kg (-7 lb 13.6 oz) Filed Weights   06/21/15 1326 06/22/15 0432 06/23/15 0338  Weight: 66.3 kg (146 lb 2.6 oz) 63.776 kg (140 lb 9.6 oz) 64.139 kg (141 lb 6.4 oz)    Intake/Output: I/O last 3 completed shifts: In: 483 [P.O.:480; I.V.:3] Out: 0      Physical Exam: General:  laying in the bed, lethargic   HEENT  anicteric sclera, moist mucous membranes   Neck  supple, no masses   Pulm/lungs  clear to auscultation anteriorly and laterally   CVS/Heart  prominent systolic murmur, regular rhythm   Abdomen:   soft, nontender, nondistended   Extremities:  ++dependent peripheral edema specially in arms  Neurologic:  lethargic but arousable. Baseline dementia.   Skin:  no acute rashes   Access:  PermCath        Basic Metabolic Panel:  Recent Labs Lab 06/20/15 0058 06/20/15 0541 06/21/15 1619 06/22/15 0428  NA 138 139 138 141  K 6.0* 4.9 3.6 3.4*  CL 99* 99* 93* 98*  CO2 17* 20* 25 30  GLUCOSE 113* 89 86 92  BUN 97* 120* 75* 37*  CREATININE 16.86* 17.32* 12.07* 7.14*  CALCIUM 8.7* 9.1 8.1* 8.3*  PHOS  --   --  6.1*  --      CBC:  Recent Labs Lab 06/20/15 0058  WBC 9.0  HGB 10.1*   HCT 31.3*  MCV 89.1  PLT 172      Microbiology: Results for orders placed or performed during the hospital encounter of 06/19/15  MRSA PCR Screening     Status: None   Collection Time: 06/20/15 11:30 AM  Result Value Ref Range Status   MRSA by PCR NEGATIVE NEGATIVE Final    Comment:        The GeneXpert MRSA Assay (FDA approved for NASAL specimens only), is one component of a comprehensive MRSA colonization surveillance program. It is not intended to diagnose MRSA infection nor to guide or monitor treatment for MRSA infections.     Coagulation Studies: No results for input(s): LABPROT, INR in the last 72 hours.  Urinalysis: No results for input(s): COLORURINE, LABSPEC, PHURINE, GLUCOSEU, HGBUR, BILIRUBINUR, KETONESUR, PROTEINUR, UROBILINOGEN, NITRITE, LEUKOCYTESUR in the last 72 hours.  Invalid input(s): APPERANCEUR    Imaging: No results found.   Medications:     . amLODipine  10 mg Oral Daily  . aspirin EC  81 mg Oral Daily  . clopidogrel  75 mg Oral Daily  . donepezil  5 mg Oral QHS  . epoetin (EPOGEN/PROCRIT) injection  4,000 Units Intravenous Q M,W,F-HD  . feeding supplement (NEPRO CARB STEADY)  237 mL Oral Q1500  . heparin  5,000 Units Subcutaneous 3 times per day  . hydrALAZINE  50 mg Oral 3 times per day  . insulin aspart  0-15 Units Subcutaneous TID WC  . isosorbide mononitrate  120 mg Oral Daily  . lisinopril  40 mg Oral Daily  . metoprolol tartrate  50 mg Oral BID  . polyethylene glycol  17 g Oral Daily  . risperiDONE  3 mg Oral Daily  . sertraline  50 mg Oral Daily  . sevelamer carbonate  2,400 mg Oral TID WC  . sodium chloride  3 mL Intravenous Q12H  . vitamin B-12  1,000 mcg Oral Daily   acetaminophen **OR** acetaminophen, diazepam, LORazepam, ondansetron **OR** ondansetron (ZOFRAN) IV  Assessment/ Plan:  81 y.o. female with end-stage renal disease, hypertension, dementia, diabetes  1. End-stage renal disease with Hyperkalemia. Patient  is not currently enrolled in chronic dialysis program because of lack of family support. Patient would not get on the machine without her great niece being present there.  She would cause disruption in the treatment of other dialysis patient is therefore she was discharged from the outpatient dialysis unit. Patient's great niece has some family issues due to which she is not able to sit with the patient.  Socially, it is a very difficult situation. Patient will need some kind of chemical restraint to be able to get through dialysis treatment without agitation At present, she is not accepted to the outpatient dialysis unit unless she is able to demonstrate that she can sit through the treatment safely.  Orders are written for respiratory and Valium prior to dialysis. We will see how she responds over the next couple of treatment  2. Anemia of chronic kidney disease. Hemoglobin 10.1. - We will prescribe Procrit during dialysis treatment  3. Secondary hyperparathyroidism - Sevelamer 2400 mg 3 times a day with meals   LOS: 3 Puneet Selden 8/12/20161:45 PM

## 2015-06-23 NOTE — Progress Notes (Signed)
Went to give patient medication and do shift assessment.  Pt lethargic. Pt only moaning to physical stimulation and to name. Could barely open eyes. Eyes appeared to be glazed over. Vital signs stable. Blood sugar 61. Pt did not eat dinner due to dialysis. Pt given ativan before dialysis. Attempted to awaken patient multiple times. MD notified Second RN called to room to assess. Unsuccessful with stimulation. MD notified. Rapid Response called. House supervisor assessed. Spoke to MD. Rechecked sugar. CBG 159. MD assessed patient. ABG ordered. No further orders at this time. Will continue to monitor.

## 2015-06-23 NOTE — Progress Notes (Signed)
Nutrition Follow-up     INTERVENTION:   Meals and Snacks: Cater to patient preferences; may benefit from downgrading to Dysphagia III for ease of chewing Medical Food Supplement Therapy: pt reports she does not like "white milk" but does like chocolate and strawberry; will send Mighty Shakes TID with meals, Magic Cup BID as well. Continue Nepro daily as unsure if pt is drinking this  NUTRITION DIAGNOSIS:   Inadequate oral intake related to poor appetite as evidenced by meal completion < 25%. Continues but being addressed as modifying supplement regimen   GOAL:   Patient will meet greater than or equal to 90% of their needs   MONITOR:    (Energy Intake, Anthropometrics, Digestive System, Electrolyte/Renal Profile)  REASON FOR ASSESSMENT:   Diagnosis (Renal Diet)    ASSESSMENT:    Pt alert but confused, severe dementia at baseline. Pt continues with dialysis, receiving valium before treatments   Diet Order:  Diet renal/carb modified with fluid restriction Diet-HS Snack?: Nothing; Room service appropriate?: Yes; Fluid consistency:: Thin Diet - low sodium heart healthy  Energy Intake: recorded po intake mostly bites; per CNA, pt ate very little today stating that she did not eat the foods we are sending here. Told CNA she does not eat rice because she is healthy. On visit, pt reports she is "not choicey;" reports she will eat anything. When asked pt if she eats rice, pt stated yes  Electrolyte and Renal Profile:  Recent Labs Lab 06/20/15 0541 06/21/15 1619 06/22/15 0428  BUN 120* 75* 37*  CREATININE 17.32* 12.07* 7.14*  NA 139 138 141  K 4.9 3.6 3.4*  PHOS  --  6.1*  --    Glucose Profile:  Recent Labs  06/22/15 1939 06/23/15 0753 06/23/15 1139  GLUCAP 128* 145* 121*   Protein Profile:  Recent Labs Lab 06/20/15 0058 06/21/15 1619  ALBUMIN 3.2* 2.7*    Skin:  Reviewed, no issues  Last BM:  8/9  Height:   Ht Readings from Last 1 Encounters:  06/20/15   (1.626 m)    Weight:   Wt Readings from Last 1 Encounters:  06/23/15 141 lb 6.4 oz (64.139 kg)   Filed Weights   06/21/15 1326 06/22/15 0432 06/23/15 0338  Weight: 146 lb 2.6 oz (66.3 kg) 140 lb 9.6 oz (63.776 kg) 141 lb 6.4 oz (64.139 kg)    BMI:  Body mass index is 24.26 kg/(m^2).  Estimated Nutritional Needs:   Kcal:  1480-1615 kcals (BEE 1035, 1.3 AF, 1.1-1.2 IF)   Protein:  77-90 g (1.2-1.4 g/kg)   Fluid:  1000 mL plus UOP   HIGH Care Level  Romelle Starcher MS, RD, LDN [phone] Pager

## 2015-06-23 NOTE — Progress Notes (Signed)
[facility] - [place] at [facility]   PATIENT NAME: Caroline Flores    MR#:  [id number]  DATE OF BIRTH:  1920/03/26  SUBJECTIVE:  CHIEF COMPLAINT:   Chief Complaint  Patient presents with  . Generalized Body Aches   No concerns. HD today  REVIEW OF SYSTEMS:  Pt is not able to tell. Confused. Negative for all ROS  Review of Systems  Constitutional: Negative for fever and chills.  HENT: Negative for sore throat.   Eyes: Negative for blurred vision, double vision and pain.  Respiratory: Negative for cough, hemoptysis, shortness of breath and wheezing.   Cardiovascular: Negative for chest pain, palpitations, orthopnea and leg swelling.  Gastrointestinal: Negative for heartburn, nausea, vomiting, abdominal pain, diarrhea and constipation.  Genitourinary: Negative for dysuria and hematuria.  Musculoskeletal: Negative for back pain and joint pain.  Skin: Negative for rash.  Neurological: Negative for sensory change, speech change, focal weakness and headaches.  Endo/Heme/Allergies: Does not bruise/bleed easily.  Psychiatric/Behavioral: Negative for depression. The patient is not nervous/anxious.     DRUG ALLERGIES:  No Known Allergies  VITALS:  Blood pressure 144/58, pulse 77, temperature 97.3 F (36.3 C), temperature source Oral, resp. rate 18, height 5\' 4"  (1.626 m), weight 64.139 kg (141 lb 6.4 oz), SpO2 94 %.  PHYSICAL EXAMINATION:  GENERAL:  81 y.o.-year-old patient lying in the bed with no acute distress.  EYES: Pupils equal, round, reactive to light and accommodation. No scleral icterus. Extraocular muscles intact.  HEENT: Head atraumatic, normocephalic. Oropharynx and nasopharynx clear.  NECK:  Supple, no jugular venous distention. No thyroid enlargement, no tenderness.  LUNGS: Normal breath sounds bilaterally, no wheezing, positive crepitation. No use of accessory muscles of respiration.  CARDIOVASCULAR: S1, S2 normal. No murmurs, rubs, or  gallops.  ABDOMEN: Soft, nontender, nondistended. Bowel sounds present. No organomegaly or mass.  EXTREMITIES: No pedal edema, cyanosis, or clubbing.  NEUROLOGIC: Cranial nerves II through XII are intact. Muscle strength 3-4 /5 in all extremities. Not able to check further- as she is confused and not very co-operative. PSYCHIATRIC: The patient is confused and somewhat agitated. SKIN: No obvious rash, lesion, or ulcer.   Physical Exam LABORATORY PANEL:   CBC  Recent Labs Lab 06/20/15 0058  WBC 9.0  HGB 10.1*  HCT 31.3*  PLT 172   ------------------------------------------------------------------------------------------------------------------  Chemistries   Recent Labs Lab 06/20/15 0058  06/22/15 0428  NA 138  < > 141  K 6.0*  < > 3.4*  CL 99*  < > 98*  CO2 17*  < > 30  GLUCOSE 113*  < > 92  BUN 97*  < > 37*  CREATININE 16.86*  < > 7.14*  CALCIUM 8.7*  < > 8.3*  AST 18  --   --   ALT 10*  --   --   ALKPHOS 50  --   --   BILITOT 0.8  --   --   < > = values in this interval not displayed. ------------------------------------------------------------------------------------------------------------------  Cardiac Enzymes  Recent Labs Lab 06/20/15 0541 06/20/15 1739  TROPONINI 0.08* 0.09*   ------------------------------------------------------------------------------------------------------------------  RADIOLOGY:  No results found.  ASSESSMENT AND PLAN:    * End-stage renal disease on hemodialysis-Tuesday/Thursday/Saturday. Missed HD all last week cause she refused it. Patient unable to make own decisions   Nephrology consult to help with HD here. Discussed with Dr. Thedore Mins.  * Hyperkalemia secondary to end-stage renal disease.  Resolved  * Elevated troponin is minimal in setting of ESRD.  Insignificant  * uremic encephalopathy - Resolved.  * Dementia Start risperidone and valium on HD days in AM. Appreciate psychiatry help  * Hypertension, stable on  home medications. * Diabetes mellitus type 2 stable clinically.  All the records are reviewed and case discussed with Care Management/Social Workerr. Management plans discussed with the patient, family and they are in agreement.  CODE STATUS: full.  TOTAL TIME TAKING CARE OF THIS PATIENT:35 minutes.   Discussed with Dr. Thedore Mins. He wants to watch patient in hospital over 2 days of dialysis if she stays calm and then accept her as outpatient dialysis candidate. Likely d/c Monday or Tuesday   Milagros Loll R M.D on 06/23/2015   Between 7am to 6pm - Pager - [phone]  After 6pm go to [email or web address] - password [email or web address]  [facility]  Office  [phone]  CC: Primary care physician; Pcp Not In System

## 2015-06-23 NOTE — Consult Note (Signed)
Palliative Care Update  Palliative Care consult is initiated, but I have no changes in plan to report as yet.   Full note (and conversations with patient's family and care team) to follow.  Cammie Mcgee, MD

## 2015-06-23 NOTE — Progress Notes (Signed)
eval patient at bedisde for altered mental status Lethargic, moan to painful stimuli accu within normal limits Given ativan previously, Vital signs stable Will check abg to check for co2 narcosis  Continue to monitor

## 2015-06-24 LAB — GLUCOSE, CAPILLARY
GLUCOSE-CAPILLARY: 151 mg/dL — AB (ref 65–99)
Glucose-Capillary: 231 mg/dL — ABNORMAL HIGH (ref 65–99)
Glucose-Capillary: 123 mg/dL — ABNORMAL HIGH (ref 65–99)
Glucose-Capillary: 156 mg/dL — ABNORMAL HIGH (ref 65–99)

## 2015-06-24 MED ORDER — HYDRALAZINE HCL 50 MG PO TABS
100.0000 mg | ORAL_TABLET | Freq: Three times a day (TID) | ORAL | Status: DC
  Administered 2015-06-24 – 2015-06-26 (×5): 100 mg via ORAL
  Filled 2015-06-24 (×7): qty 2

## 2015-06-24 NOTE — Progress Notes (Signed)
[facility] - [place] at [facility]   PATIENT NAME: Caroline Flores    MR#:  [id number]  DATE OF BIRTH:  1920/06/06  SUBJECTIVE:  CHIEF COMPLAINT:   Chief Complaint  Patient presents with  . Generalized Body Aches   No concerns.  REVIEW OF SYSTEMS:  Pt is not able to tell. Confused. Negative for all ROS  Review of Systems  Constitutional: Negative for fever and chills.  HENT: Negative for sore throat.   Eyes: Negative for blurred vision, double vision and pain.  Respiratory: Negative for cough, hemoptysis, shortness of breath and wheezing.   Cardiovascular: Negative for chest pain, palpitations, orthopnea and leg swelling.  Gastrointestinal: Negative for heartburn, nausea, vomiting, abdominal pain, diarrhea and constipation.  Genitourinary: Negative for dysuria and hematuria.  Musculoskeletal: Negative for back pain and joint pain.  Skin: Negative for rash.  Neurological: Negative for sensory change, speech change, focal weakness and headaches.  Endo/Heme/Allergies: Does not bruise/bleed easily.  Psychiatric/Behavioral: Negative for depression. The patient is not nervous/anxious.     DRUG ALLERGIES:  No Known Allergies  VITALS:  Blood pressure 156/74, pulse 96, temperature 97.9 F (36.6 C), temperature source Axillary, resp. rate 20, height  (1.626 m), weight 66.5 kg (146 lb 9.7 oz), SpO2 100 %.  PHYSICAL EXAMINATION:  GENERAL:  81 y.o.-year-old patient lying in the bed with no acute distress.  EYES: Pupils equal, round, reactive to light and accommodation. No scleral icterus. Extraocular muscles intact.  HEENT: Head atraumatic, normocephalic. Oropharynx and nasopharynx clear.  NECK:  Supple, no jugular venous distention. No thyroid enlargement, no tenderness.  LUNGS: Normal breath sounds bilaterally, no wheezing, positive crepitation. No use of accessory muscles of respiration.  CARDIOVASCULAR: S1, S2 normal. No murmurs, rubs, or gallops.   ABDOMEN: Soft, nontender, nondistended. Bowel sounds present. No organomegaly or mass.  EXTREMITIES: No pedal edema, cyanosis, or clubbing.  NEUROLOGIC: Cranial nerves II through XII are intact. Muscle strength 3-4 /5 in all extremities. Not able to check further- as she is confused and not very co-operative. PSYCHIATRIC: The patient is confused and somewhat agitated. SKIN: No obvious rash, lesion, or ulcer.   Physical Exam LABORATORY PANEL:   CBC  Recent Labs Lab 06/23/15 1718  WBC 6.8  HGB 9.3*  HCT 28.9*  PLT 178   ------------------------------------------------------------------------------------------------------------------  Chemistries   Recent Labs Lab 06/20/15 0058  06/23/15 1718  NA 138  < > 139  K 6.0*  < > 4.0  CL 99*  < > 98*  CO2 17*  < > 30  GLUCOSE 113*  < > 81  BUN 97*  < > 48*  CREATININE 16.86*  < > 8.92*  CALCIUM 8.7*  < > 8.4*  AST 18  --   --   ALT 10*  --   --   ALKPHOS 50  --   --   BILITOT 0.8  --   --   < > = values in this interval not displayed. ------------------------------------------------------------------------------------------------------------------  Cardiac Enzymes  Recent Labs Lab 06/20/15 0541 06/20/15 1739  TROPONINI 0.08* 0.09*   ------------------------------------------------------------------------------------------------------------------  RADIOLOGY:  No results found.  ASSESSMENT AND PLAN:    * End-stage renal disease on hemodialysis-Tuesday/Thursday/Saturday. Missed HD all last week cause she refused it. Patient unable to make own decisions   Nephrology consult to help with HD here.  * Hyperkalemia secondary to end-stage renal disease.  Resolved  * Elevated troponin is minimal in setting of ESRD. Insignificant  * uremic encephalopathy -  Resolved.  * Dementia Start risperidone and valium on HD days in AM. Appreciate psychiatry help  * Hypertension, stable on home medications. * Diabetes  mellitus type 2 stable clinically.  All the records are reviewed and case discussed with Care Management/Social Workerr. Management plans discussed with the patient, family and they are in agreement.  CODE STATUS: full.  TOTAL TIME TAKING CARE OF THIS PATIENT:35 minutes.   Likely d/c on Monday to SNF if OP dialysis can be set up   Milagros Loll R M.D on 06/24/2015   Between 7am to 6pm - Pager - [phone]  After 6pm go to [email or web address] - password [email or web address]  [facility]  Office  [phone]  CC: Primary care physician; Pcp Not In System

## 2015-06-24 NOTE — Progress Notes (Signed)
PRE HD   

## 2015-06-24 NOTE — Progress Notes (Signed)
Subjective:   Hemodialysis for later today. Patient given risperidone this morning. More somnulent than as outpatient.  No family at bedside.   Objective:  Vital signs in last 24 hours:  Temp:  [97.9 F (36.6 C)] 97.9 F (36.6 C) (08/12 2045) Pulse Rate:  [65-96] 96 (08/13 1007) Resp:  [13-31] 20 (08/12 2231) BP: (136-185)/(55-77) 156/74 mmHg (08/13 1007) SpO2:  [98 %-100 %] 100 % (08/12 2231) Weight:  [66.5 kg (146 lb 9.7 oz)] 66.5 kg (146 lb 9.7 oz) (08/12 1715)  Weight change: 2.361 kg (5 lb 3.3 oz) Filed Weights   06/22/15 0432 06/23/15 0338 06/23/15 1715  Weight: 63.776 kg (140 lb 9.6 oz) 64.139 kg (141 lb 6.4 oz) 66.5 kg (146 lb 9.7 oz)    Intake/Output: I/O last 3 completed shifts: In: 120 [P.O.:120] Out: 1500 [Other:1500]     Physical Exam: General:  laying in the bed, lethargic   HEENT  anicteric sclera, moist mucous membranes   Neck  supple, no masses   Pulm/lungs  clear to auscultation anteriorly and laterally   CVS/Heart  prominent systolic murmur, regular rhythm   Abdomen:   soft, nontender, nondistended   Extremities:  ++dependent peripheral edema specially in arms  Neurologic:  lethargic but arousable. Baseline dementia.   Skin:  no acute rashes   Access:  PermCath        Basic Metabolic Panel:  Recent Labs Lab 06/20/15 0058 06/20/15 0541 06/21/15 1619 06/22/15 0428 06/23/15 1718  NA 138 139 138 141 139  K 6.0* 4.9 3.6 3.4* 4.0  CL 99* 99* 93* 98* 98*  CO2 17* 20* 25 30 30   GLUCOSE 113* 89 86 92 81  BUN 97* 120* 75* 37* 48*  CREATININE 16.86* 17.32* 12.07* 7.14* 8.92*  CALCIUM 8.7* 9.1 8.1* 8.3* 8.4*  PHOS  --   --  6.1*  --  4.4     CBC:  Recent Labs Lab 06/20/15 0058 06/23/15 1718  WBC 9.0 6.8  HGB 10.1* 9.3*  HCT 31.3* 28.9*  MCV 89.1 88.7  PLT 172 178      Microbiology: Results for orders placed or performed during the hospital encounter of 06/19/15  MRSA PCR Screening     Status: None   Collection Time: 06/20/15  11:30 AM  Result Value Ref Range Status   MRSA by PCR NEGATIVE NEGATIVE Final    Comment:        The GeneXpert MRSA Assay (FDA approved for NASAL specimens only), is one component of a comprehensive MRSA colonization surveillance program. It is not intended to diagnose MRSA infection nor to guide or monitor treatment for MRSA infections.     Coagulation Studies: No results for input(s): LABPROT, INR in the last 72 hours.  Urinalysis: No results for input(s): COLORURINE, LABSPEC, PHURINE, GLUCOSEU, HGBUR, BILIRUBINUR, KETONESUR, PROTEINUR, UROBILINOGEN, NITRITE, LEUKOCYTESUR in the last 72 hours.  Invalid input(s): APPERANCEUR    Imaging: No results found.   Medications:     . amLODipine  10 mg Oral Daily  . aspirin EC  81 mg Oral Daily  . clopidogrel  75 mg Oral Daily  . donepezil  5 mg Oral QHS  . epoetin (EPOGEN/PROCRIT) injection  4,000 Units Intravenous Q M,W,F-HD  . feeding supplement (NEPRO CARB STEADY)  237 mL Oral Q1500  . heparin  5,000 Units Subcutaneous 3 times per day  . hydrALAZINE  100 mg Oral 3 times per day  . insulin aspart  0-15 Units Subcutaneous TID WC  . insulin  aspart  0-5 Units Subcutaneous QHS  . isosorbide mononitrate  120 mg Oral Daily  . lisinopril  40 mg Oral Daily  . metoprolol tartrate  50 mg Oral BID  . polyethylene glycol  17 g Oral Daily  . risperiDONE  3 mg Oral Daily  . sertraline  50 mg Oral Daily  . sevelamer carbonate  2,400 mg Oral TID WC  . sodium chloride  3 mL Intravenous Q12H  . vitamin B-12  1,000 mcg Oral Daily   acetaminophen **OR** acetaminophen, diazepam, LORazepam, ondansetron **OR** ondansetron (ZOFRAN) IV  Assessment/ Plan:  81 y.o. female with end-stage renal disease, hypertension, dementia, diabetes  1. End-stage renal disease with Hyperkalemia. Patient is not currently enrolled in chronic dialysis program because of lack of family support. Patient would not get on the machine without her great niece being  present there.  She would cause disruption in the treatment of other dialysis patient is therefore she was discharged from the outpatient dialysis unit. Patient's great niece has some family issues due to which she is not able to sit with the patient.  Socially, it is a very difficult situation. Patient will need some kind of chemical restraint to be able to get through dialysis treatment without agitation.  - lorazepam, diazepam and risperidone ordered.  - plan for hemodialysis later today.  We will see how she responds over the next couple of treatment Potassium back to baseline.   2. Anemia of chronic kidney disease. Hemoglobin 9.3 - epo with hemodialysis treatment.   3. Secondary hyperparathyroidism: phosphorus at goal, 4.4. Was not at goal on admission due to missing medications. PTH at goal as outpatient.  - Sevelamer 2400 mg 3 times a day with meals  4. Hypoalbuminemia: low at 2.7. Concerning for poor PO intake.    LOS: 4 Darwyn Ponzo 8/13/201612:28 PM

## 2015-06-24 NOTE — Progress Notes (Signed)
Pt taken down for HD tonight.  HD nurse already in hospital, would like to try to do tonight.  Sitter going down with patient.  Will continue to monitor.  Louis Meckel

## 2015-06-24 NOTE — Progress Notes (Signed)
Patient alert and oriented to person, no complaints at this time. vss at this time. Patient NSR on telemetry. Will continue to assess. Patient is drowsy, but easily arsousable. Trudee Kuster

## 2015-06-24 NOTE — Progress Notes (Signed)
PRE HD   06/24/15 2045  Neurological  Level of Consciousness Alert  Orientation Level Oriented to person  Respiratory  Respiratory Pattern Regular  Chest Assessment Chest expansion symmetrical  Bilateral Breath Sounds Clear;Diminished  Cardiac  Pulse Regular  ECG Monitor Yes  Cardiac Rhythm NSR  Antiarrhythmic device No  Vascular  Edema Left upper extremity;Left lower extremity;Generalized  Generalized Edema None  LUE Edema +1  LLE Edema +1  Integumentary  Integumentary (WDL) WDL  Musculoskeletal  Musculoskeletal (WDL) X  Generalized Weakness Yes  Gastrointestinal  Bowel Sounds Assessment Active  GU Assessment  Genitourinary (WDL) X  Genitourinary Symptoms Other (Comment) (HD)  Psychosocial  Psychosocial (WDL) X  Patient Behaviors Calm;Agitated;Cooperative  Needs Expressed Denies  Emotional support given Given to patient

## 2015-06-24 NOTE — Progress Notes (Signed)
PRE HD   06/24/15 2045  Vital Signs  Temp 98.2 F (36.8 C)  Temp Source Oral  Pulse Rate 83  Pulse Rate Source Monitor  Resp 20  BP (!) 143/69 mmHg  BP Location Right Arm  BP Method Automatic  Patient Position (if appropriate) Lying  Oxygen Therapy  SpO2 98 %  O2 Device Room Air  Pain Assessment  Pain Assessment 0-10  Pain Score Asleep  Dialysis Weight  Weight 63.3 kg (139 lb 8.8 oz)  Type of Weight Pre-Dialysis  Time-Out for Hemodialysis  What Procedure? HEMODIALYSIS  Pt Identifiers(min of two) First/Last Name;MRN/Account#  Correct Site? Yes  Correct Side? Yes  Correct Procedure? Yes  Consents Verified? Yes  Rad Studies Available? N/A  Safety Precautions Reviewed? Yes  Biochemist, clinical Number [phone]  Station Number 1  UF/Alarm Test Passed  Conductivity: Meter 13.9  Conductivity: Machine  14.5  pH 7.4  Reverse Osmosis MAIN  Dialyzer Lot Number [id number]  Disposable Set Lot Number [id number]  Machine Temperature 98.6 F (37 C)  Immunologist and Audible Yes  Blood Lines Intact and Secured Yes  Pre Treatment Patient Checks  Vascular access used during treatment Catheter  Hepatitis B Surface Antigen Results Negative  Date Hepatitis B Surface Antigen Drawn 06/20/15  Hemodialysis Consent Verified Yes  Hemodialysis Standing Orders Initiated Yes  ECG (Telemetry) Monitor On Yes  Prime Ordered Normal Saline  Length of  DialysisTreatment -hour(s) 3 Hour(s)  Dialysis Treatment Comments GOAL TO REMOVE 2L OVER 3 HOURS.  PT  RESTING, BUT WILL RESPOND TO VOICE.  CVC ACCESSED WITHOUT DIFFICULTY.   Dialyzer Optiflux 180 NR  Dialysate Other (Comment) (3K2.5CA)  Dialysis Anticoagulant None  Dialysate Flow Ordered 600  Blood Flow Rate Ordered 400 mL/min  Ultrafiltration Goal 2 Liters  Pre Treatment Labs Renal panel;CBC;Phosphorus  Dialysis Blood Pressure Support Ordered Normal Saline  Hemodialysis Catheter Left  Placement Date: 06/20/15   Placed prior to  admission: Yes  Orientation: Left  Site Condition No complications  Blue Lumen Status Heparin locked  Red Lumen Status Heparin locked  Purple Lumen Status N/A  Catheter fill solution Heparin 1000 units/ml  Catheter fill volume (Arterial) 1.7 cc  Catheter fill volume (Venous) 1.7  Dressing Type Gauze/Drain sponge  Dressing Status Clean;Dry;Intact  Interventions Dressing changed  Drainage Description None  Dressing Change Due 06/27/15

## 2015-06-25 LAB — GLUCOSE, CAPILLARY
GLUCOSE-CAPILLARY: 258 mg/dL — AB (ref 65–99)
GLUCOSE-CAPILLARY: 92 mg/dL (ref 65–99)
GLUCOSE-CAPILLARY: 231 mg/dL — AB (ref 65–99)
Glucose-Capillary: 115 mg/dL — ABNORMAL HIGH (ref 65–99)

## 2015-06-25 NOTE — Progress Notes (Signed)
Patient alert and oriented to person, no complaints at this time. vss at this time. Patient NSR on telemetry. Will continue to assess. All meds tolerated well this am. Trudee Kuster

## 2015-06-25 NOTE — Progress Notes (Signed)
[facility] [place] [facility]   PATIENT NAME: Caroline Flores    MR#:  [id number]  DATE OF BIRTH:  [birth year]  SUBJECTIVE:  CHIEF COMPLAINT:   Chief Complaint  Patient presents with  . Generalized Body Aches   No concerns. Drowsy through her dialysis and tolerated well. REVIEW OF SYSTEMS:  Pt is not able to tell. Confused. Negative for all ROS  Review of Systems  Constitutional: Negative for fever and chills.  HENT: Negative for sore throat.   Eyes: Negative for blurred vision, double vision and pain.  Respiratory: Negative for cough, hemoptysis, shortness of breath and wheezing.   Cardiovascular: Negative for chest pain, palpitations, orthopnea and leg swelling.  Gastrointestinal: Negative for heartburn, nausea, vomiting, abdominal pain, diarrhea and constipation.  Genitourinary: Negative for dysuria and hematuria.  Musculoskeletal: Negative for back pain and joint pain.  Skin: Negative for rash.  Neurological: Negative for sensory change, speech change, focal weakness and headaches.  Endo/Heme/Allergies: Does not bruise/bleed easily.  Psychiatric/Behavioral: Negative for depression. The patient is not nervous/anxious.     DRUG ALLERGIES:  No Known Allergies  VITALS:  Blood pressure 127/57, pulse 76, temperature 97.7 F (36.5 C), temperature source Oral, resp. rate 18, height 5\' 4"  (1.626 m), weight 61.372 kg (135 lb 4.8 oz), SpO2 95 %.  PHYSICAL EXAMINATION:  GENERAL:  81 y.o.-year-old patient lying in the bed with no acute distress.  EYES: Pupils equal, round, reactive to light and accommodation. No scleral icterus. Extraocular muscles intact.  HEENT: Head atraumatic, normocephalic. Oropharynx and nasopharynx clear.  NECK:  Supple, no jugular venous distention. No thyroid enlargement, no tenderness.  LUNGS: Normal breath sounds bilaterally, no wheezing, positive crepitation. No use of accessory muscles of respiration.  CARDIOVASCULAR: S1,  S2 normal. No murmurs, rubs, or gallops.  ABDOMEN: Soft, nontender, nondistended. Bowel sounds present. No organomegaly or mass.  EXTREMITIES: No pedal edema, cyanosis, or clubbing.  NEUROLOGIC: Cranial nerves II through XII are intact. Muscle strength 3-4 /5 in all extremities. Not able to check further- as she is confused and not very co-operative. PSYCHIATRIC: The patient is confused and somewhat agitated. SKIN: No obvious rash, lesion, or ulcer.   Physical Exam LABORATORY PANEL:   CBC  Recent Labs Lab 06/23/15 1718  WBC 6.8  HGB 9.3*  HCT 28.9*  PLT 178   ------------------------------------------------------------------------------------------------------------------  Chemistries   Recent Labs Lab 06/20/15 0058  06/23/15 1718  NA 138  < > 139  K 6.0*  < > 4.0  CL 99*  < > 98*  CO2 17*  < > 30  GLUCOSE 113*  < > 81  BUN 97*  < > 48*  CREATININE 16.86*  < > 8.92*  CALCIUM 8.7*  < > 8.4*  AST 18  --   --   ALT 10*  --   --   ALKPHOS 50  --   --   BILITOT 0.8  --   --   < > = values in this interval not displayed. ------------------------------------------------------------------------------------------------------------------  Cardiac Enzymes  Recent Labs Lab 06/20/15 0541 06/20/15 1739  TROPONINI 0.08* 0.09*   ------------------------------------------------------------------------------------------------------------------  RADIOLOGY:  No results found.  ASSESSMENT AND PLAN:    * End-stage renal disease on hemodialysis-Tuesday/Thursday/Saturday. Missed HD all last week cause she refused it. Patient unable to make own decisions   Nephrology consult to help with HD here.  * Hyperkalemia secondary to end-stage renal disease.  Resolved  * Elevated troponin is minimal in setting of ESRD.  Insignificant  * uremic encephalopathy - Resolved.  * Dementia Start risperidone and valium on HD days in AM. Appreciate psychiatry help  * Hypertension,  stable on home medications. * Diabetes mellitus type 2 stable clinically.  All the records are reviewed and case discussed with Care Management/Social Workerr. Management plans discussed with the patient, family and they are in agreement.  CODE STATUS: full.  TOTAL TIME TAKING CARE OF THIS PATIENT:35 minutes.   Waiting for nephrology to clear patient for setting up dialysis as outpatient and discharge to SNF.   Milagros Loll R M.D on 06/25/2015   Between 7am to 6pm - Pager - [phone]  After 6pm go to [email or web address] - password [email or web address]  [facility]  Office  [phone]  CC: Primary care physician; Pcp Not In System

## 2015-06-25 NOTE — Progress Notes (Signed)
MD, Dr. Clint Guy notified due to BP of 115/39-- metoprolol 50 and hydralazine 100 ordered.  MD advised to still give both medications, pt received HD last night.  Will continue to monitor. Louis Meckel

## 2015-06-25 NOTE — Progress Notes (Addendum)
MD called to verify nightly medications.  Pt just returned from HD- removed 2 liters.  Hydralazine and metoprolol ordered for 2200.  MD ordered to give metoprolol and hold hydralazine.  Will continue to monitor. Louis Meckel

## 2015-06-25 NOTE — Progress Notes (Signed)
Pt was very lethargic at 2200 med pass. Pt unable to take PO tablets. Will continue to assess vitals.

## 2015-06-25 NOTE — Progress Notes (Signed)
Subjective:   Hemodialysis yesterday. No behavioral issues. Patient slept for most of the procedure. She tolerated procedure well.   Objective:  Vital signs in last 24 hours:  Temp:  [97.8 F (36.6 C)-99.4 F (37.4 C)] 99.4 F (37.4 C) (08/14 0536) Pulse Rate:  [66-93] 86 (08/14 0906) Resp:  [12-20] 18 (08/14 0536) BP: (121-155)/(45-73) 133/45 mmHg (08/14 0906) SpO2:  [96 %-98 %] 96 % (08/14 0536) Weight:  [60.6 kg (133 lb 9.6 oz)-63.3 kg (139 lb 8.8 oz)] 61.372 kg (135 lb 4.8 oz) (08/14 0536)  Weight change: -3.2 kg (-7 lb 0.9 oz) Filed Weights   06/25/15 0000 06/25/15 0012 06/25/15 0536  Weight: 60.6 kg (133 lb 9.6 oz) 61.462 kg (135 lb 8 oz) 61.372 kg (135 lb 4.8 oz)    Intake/Output: I/O last 3 completed shifts: In: 360 [P.O.:360] Out: 3500 [Other:3500]     Physical Exam: General:  laying in the bed, lethargic   HEENT  anicteric sclera, moist mucous membranes   Neck  supple, no masses   Pulm/lungs  clear to auscultation anteriorly and laterally   CVS/Heart  prominent systolic murmur, regular rhythm   Abdomen:   soft, nontender, nondistended   Extremities:  no dependent and peripheral edema   Neurologic:  lethargic but arousable. Baseline dementia.   Skin:  no acute rashes   Access:  PermCath        Basic Metabolic Panel:  Recent Labs Lab 06/20/15 0058 06/20/15 0541 06/21/15 1619 06/22/15 0428 06/23/15 1718  NA 138 139 138 141 139  K 6.0* 4.9 3.6 3.4* 4.0  CL 99* 99* 93* 98* 98*  CO2 17* 20* GLUCOSE 113* 89 86 92 81  BUN 97* 120* 75* 37* 48*  CREATININE 16.86* 17.32* 12.07* 7.14* 8.92*  CALCIUM 8.7* 9.1 8.1* 8.3* 8.4*  PHOS  --   --  6.1*  --  4.4     CBC:  Recent Labs Lab 06/20/15 0058 06/23/15 1718  WBC 9.0 6.8  HGB 10.1* 9.3*  HCT 31.3* 28.9*  MCV 89.1 88.7  PLT 172 178      Microbiology: Results for orders placed or performed during the hospital encounter of 06/19/15  MRSA PCR Screening     Status: None   Collection  Time: 06/20/15 11:30 AM  Result Value Ref Range Status   MRSA by PCR NEGATIVE NEGATIVE Final    Comment:        The GeneXpert MRSA Assay (FDA approved for NASAL specimens only), is one component of a comprehensive MRSA colonization surveillance program. It is not intended to diagnose MRSA infection nor to guide or monitor treatment for MRSA infections.     Coagulation Studies: No results for input(s): LABPROT, INR in the last 72 hours.  Urinalysis: No results for input(s): COLORURINE, LABSPEC, PHURINE, GLUCOSEU, HGBUR, BILIRUBINUR, KETONESUR, PROTEINUR, UROBILINOGEN, NITRITE, LEUKOCYTESUR in the last 72 hours.  Invalid input(s): APPERANCEUR    Imaging: No results found.   Medications:     . amLODipine  10 mg Oral Daily  . aspirin EC  81 mg Oral Daily  . clopidogrel  75 mg Oral Daily  . donepezil  5 mg Oral QHS  . epoetin (EPOGEN/PROCRIT) injection  4,000 Units Intravenous Q M,W,F-HD  . feeding supplement (NEPRO CARB STEADY)  237 mL Oral Q1500  . heparin  5,000 Units Subcutaneous 3 times per day  . hydrALAZINE  100 mg Oral 3 times per day  . insulin aspart  0-15 Units Subcutaneous  TID WC  . insulin aspart  0-5 Units Subcutaneous QHS  . isosorbide mononitrate  120 mg Oral Daily  . lisinopril  40 mg Oral Daily  . metoprolol tartrate  50 mg Oral BID  . polyethylene glycol  17 g Oral Daily  . risperiDONE  3 mg Oral Daily  . sertraline  50 mg Oral Daily  . sevelamer carbonate  2,400 mg Oral TID WC  . sodium chloride  3 mL Intravenous Q12H  . vitamin B-12  1,000 mcg Oral Daily   acetaminophen **OR** acetaminophen, diazepam, LORazepam, ondansetron **OR** ondansetron (ZOFRAN) IV  Assessment/ Plan:  81 y.o. female with end-stage renal disease, hypertension, dementia, diabetes  1. End-stage renal disease with Hyperkalemia. Patient is not currently enrolled in chronic dialysis program because of lack of family support. Patient would not get on the machine without her  great niece being present there.  She would cause disruption in the treatment of other dialysis patient is therefore she was discharged from the outpatient dialysis unit. Patient's great niece has some family issues due to which she is not able to sit with the patient.  Socially, it is a very difficult situation. Patient will need some kind of chemical restraint to be able to get through dialysis treatment without agitation.  - lorazepam, diazepam and risperidone ordered.  - plan for hemodialysis TTS schedule, next treatment for Tuesday We will see how she responds over the next couple of treatments before discharging patient.  Potassium back to baseline.   2. Anemia of chronic kidney disease. Hemoglobin 9.3 - epo with hemodialysis treatment.   3. Secondary hyperparathyroidism: phosphorus at goal, 4.4. Was not at goal on admission due to missing medications. PTH at goal as outpatient.  - Sevelamer 2400 mg 3 times a day with meals  4. Hypoalbuminemia: low at 2.7. Concerning for poor PO intake. Nutrition consult appreciated.   5. Hypertension: well controlled.  - hydrlazine, isosorbide mononitrate, lisinopril, metoprolol and amlodipine.    LOS: 5 Caroline Flores 8/14/201610:24 AM

## 2015-06-25 NOTE — Progress Notes (Signed)
POST HD   06/25/15 0000  Vital Signs  Temp 97.8 F (36.6 C)  Temp Source Oral  Pulse Rate 89  Pulse Rate Source Monitor  Resp 16  BP 139/70 mmHg  BP Location Right Arm  BP Method Automatic  Patient Position (if appropriate) Lying  Dialysis Weight  Weight 60.6 kg (133 lb 9.6 oz)  Type of Weight Post-Dialysis  During Hemodialysis Assessment  Intra-Hemodialysis Comments HD TREATMENT ENDED.  GOAL MET.  BLOOD RETURNED AND CVC ACCESSED PER POLICY.  PT ALERT AND VS STABLE.   Post-Hemodialysis Assessment  Rinseback Volume (mL) 250 mL  Dialyzer Clearance Lightly streaked  Duration of HD Treatment -hour(s) 3 hour(s)  Hemodialysis Intake (mL) 500 mL  UF Total -Machine (mL) 2500 mL  Net UF (mL) 2000 mL  Tolerated HD Treatment Yes  Post-Hemodialysis Comments HD TREATMENT ENDED.  GOAL MET.  BLOOD RETURNED AND CVC ACCESSED PER POLICY.  PT ALERT AND VS STABLE.   Education / Care Plan  Hemodialysis Education Provided Yes  Documented Education in Clinical Pathway Yes  Hemodialysis Catheter Left  Placement Date: 06/20/15   Placed prior to admission: Yes  Orientation: Left  Site Condition No complications  Blue Lumen Status Heparin locked  Red Lumen Status Heparin locked  Purple Lumen Status N/A  Catheter fill solution Heparin 1000 units/ml  Catheter fill volume (Arterial) 1.7 cc  Catheter fill volume (Venous) 1.7  Dressing Type Gauze/Drain sponge  Dressing Status Clean;Dry;Intact  Drainage Description None  Dressing Change Due 06/27/15  Post treatment catheter status Capped and Clamped

## 2015-06-25 NOTE — Progress Notes (Signed)
Pt returned from HD, HD nurse said pt tolerated well.  2 liters removed.  Will continue to monitor. Louis Meckel

## 2015-06-25 NOTE — Progress Notes (Signed)
HD END 

## 2015-06-26 DIAGNOSIS — F0391 Unspecified dementia with behavioral disturbance: Secondary | ICD-10-CM

## 2015-06-26 DIAGNOSIS — E119 Type 2 diabetes mellitus without complications: Secondary | ICD-10-CM

## 2015-06-26 DIAGNOSIS — Z515 Encounter for palliative care: Secondary | ICD-10-CM

## 2015-06-26 LAB — GLUCOSE, CAPILLARY
Glucose-Capillary: 120 mg/dL — ABNORMAL HIGH (ref 65–99)
Glucose-Capillary: 207 mg/dL — ABNORMAL HIGH (ref 65–99)
Glucose-Capillary: 155 mg/dL — ABNORMAL HIGH (ref 65–99)
Glucose-Capillary: 84 mg/dL (ref 65–99)

## 2015-06-26 MED ORDER — RISPERIDONE 1 MG PO TABS: 3.0000 mg | ORAL_TABLET | ORAL | Status: DC

## 2015-06-26 NOTE — Care Management Note (Addendum)
Case Management Note  Patient Details  Name: [name] MRN: [id number] Date of Birth: [birth year]  Subjective/Objective:    Ms Aller was unable to contribute to the following information per confusion:  81yo Ms Caroline Flores was admitted 06/19/15 per refusing dialysis at [facility] on [address] where she receives hemodialysis every Tues-Thurs-Sat.  Resides at [facility]. Legal Guardian is Maryelizabeth Kaufmann, niece, [phone]. Social Work is following for discharge planning.               Action/Plan:   Expected Discharge Date:                  Expected Discharge Plan:     In-House Referral:     Discharge planning Services     Post Acute Care Choice:    Choice offered to:     DME Arranged:    DME Agency:     HH Arranged:    HH Agency:     Status of Service:     Medicare Important Message Given:  Yes-fourth notification given Date Medicare IM Given:    Medicare IM give by:    Date Additional Medicare IM Given:    Additional Medicare Important Message give by:     If discussed at Long Length of Stay Meetings, dates discussed:    Additional Comments:  Kierre Deines A, RN 06/26/2015, 10:00 AM

## 2015-06-26 NOTE — Progress Notes (Signed)
Patient lying in bed, still sleepy.  Tried to give her some breakfast, ate very little.  Gave all meds, great niece is at bedside.

## 2015-06-26 NOTE — Progress Notes (Signed)
Patient lying in bed with blanket on top of her head.  Did allow me to give her insulin and put the blanket right back over her head. Asked if she wanted the peaches and ice cream on her tray and she refused and became very agitated.

## 2015-06-26 NOTE — Progress Notes (Signed)
Removed telemetry from patient.  Patient lying in bed, still lethargic, still will not open her eyes when addressed. Did respond yes when I asked her if she was ok.

## 2015-06-26 NOTE — Progress Notes (Signed)
PT Attempt Note  Patient Details Name: Caroline Flores MRN: [id number] DOB: 13-Jun-1920   Cancelled Treatment:    Reason Eval/Treat Not Completed: Patient declined, no reason specified. Chart reviewed and RN consulted. Attempted to evaluate patient. Upon arrival pt with blanket over her head. When blanket removed pt states, "There's shit in my eye." Suggested that pt sit up, walk, or exercise in bed with therapist but pt continues to exclaim, "I ain't doin' shit!" Refuses to open her eyes throughout interaction and refuses to participate with therapist despite repeated encouragement. Pt not currently appropriate for PT evaluation. Will attempt evaluation on later date/time as patient will participate.  Sharalyn Ink Cherina Dhillon PT, DPT   [name] 06/26/2015, 2:48 PM

## 2015-06-26 NOTE — Progress Notes (Signed)
Gave patient time to calm back down before attempting to give medication, was agitated this afternoon especially after the PT consult. Patient lying in bed.  Very lethargic, won't open eyes. She did shake her head no when I asked her about her medication . Was able to give heparin shot, no reaction from her.

## 2015-06-26 NOTE — Progress Notes (Signed)
Patient lying in bed, no distress, states she is not in any pain.  Very sleepy, not very talkative

## 2015-06-26 NOTE — Consult Note (Signed)
Palliative Medicine Inpatient Consult Note   Name: Caroline Flores Date: 06/26/2015 MRN: [id number]  DOB: [birth year]  Referring Physician: Milagros Loll, MD  Palliative Care consult requested for this 81 y.o. female for goals of medical therapy in patient with end stage renal disease now with advanced dementia with behavioral disturbance.    TODAY'S ACTIONS AND PLAN: 1)  I had seen pt last week when she was very agitated.  She is much calmer today. She is being medicated on dialysis days so that she will rest or sleep during HD.  Today is not a dialysis day, but she is pretty sleepy.  This may be day/ night disturbance associated with dementia or she could be having some lingering effects of the medications.   2)  Patient is NOT able to make her own decisions. 3)  I was not able to reach the niece, Maryelizabeth Kaufmann [phone]). She was here and perhaps I just missed her.  I left a message on her phone line.  I would like to first of all address code status.  Goal would be to increase niece's awareness of patient's lack of a desire to go through HD, etc.  4)  I have spoken with Social Wkr to get updated on plan from that perspective.  It seems that if pt is passive during HD she may be able to be re-enrolled in an outpt HD center, but there is no guarantee of this as she had been dismissed due to behavours (fighting to stay out --get off of HD etc)  REVIEW OF SYSTEMS:  Pt is not a reliable historian.  She is verbal  can express her needs and wants--but often, these are not pertinent to the situation.    SUPPORT SYSTEM: Niece is reported to be legal guardian.   Niece once was siting with pt during HD sessions as outpt.  Then, niece was granted custody of some children in the family and with this new responsibility, she can no longer sit with pt during HD.  No other relatives or support apparently,.   SOCIAL HISTORY:  reports that she has been smoking.  She has never used smokeless tobacco. She  reports that she does not drink alcohol or use illicit drugs.  LEGAL DOCUMENTS:  none  CODE STATUS: Full code  PAST MEDICAL HISTORY: Past Medical History  Diagnosis Date  . HTN (hypertension)   . Diabetes mellitus, type II   . Hearing loss   . Memory loss   . Chronic kidney disease (CKD), stage V     GFR 2  . Dementia     PAST SURGICAL HISTORY:  Past Surgical History  Procedure Laterality Date  . Back surgery      ALLERGIES:  has No Known Allergies.  MEDICATIONS:  Current Facility-Administered Medications  Medication Dose Route Frequency Provider Last Rate Last Dose  . acetaminophen (TYLENOL) tablet 650 mg  650 mg Oral Q6H PRN Crissie Figures, MD       Or  . acetaminophen (TYLENOL) suppository 650 mg  650 mg Rectal Q6H PRN Crissie Figures, MD      . amLODipine (NORVASC) tablet 10 mg  10 mg Oral Daily Crissie Figures, MD   10 mg at 06/26/15 1046  . aspirin EC tablet 81 mg  81 mg Oral Daily Crissie Figures, MD   81 mg at 06/26/15 1047  . clopidogrel (PLAVIX) tablet 75 mg  75 mg Oral Daily Crissie Figures, MD   75 mg  at 06/26/15 1046  . diazepam (VALIUM) tablet 5 mg  5 mg Oral Daily PRN Audery Amel, MD      . donepezil (ARICEPT) tablet 5 mg  5 mg Oral QHS Crissie Figures, MD   5 mg at 06/25/15 0028  . epoetin alfa (EPOGEN,PROCRIT) injection 4,000 Units  4,000 Units Intravenous Q M,W,F-HD Mosetta Pigeon, MD   4,000 Units at 06/24/15 2216  . feeding supplement (NEPRO CARB STEADY) liquid 237 mL  237 mL Oral Q1500 Srikar Sudini, MD 240 mL/hr at 06/21/15 1507 237 mL at 06/25/15 1414  . heparin injection 5,000 Units  5,000 Units Subcutaneous 3 times per day Crissie Figures, MD   5,000 Units at 06/26/15 0547  . hydrALAZINE (APRESOLINE) tablet 100 mg  100 mg Oral 3 times per day Milagros Loll, MD   100 mg at 06/26/15 0547  . insulin aspart (novoLOG) injection 0-15 Units  0-15 Units Subcutaneous TID WC Crissie Figures, MD   7 Units at 06/25/15 1225  . insulin aspart (novoLOG)  injection 0-5 Units  0-5 Units Subcutaneous QHS Wyatt Haste, MD   2 Units at 06/25/15 2146  . isosorbide mononitrate (IMDUR) 24 hr tablet 120 mg  120 mg Oral Daily Crissie Figures, MD   120 mg at 06/26/15 1107  . lisinopril (PRINIVIL,ZESTRIL) tablet 40 mg  40 mg Oral Daily Milagros Loll, MD   40 mg at 06/26/15 1046  . LORazepam (ATIVAN) injection 0.5 mg  0.5 mg Intravenous Once PRN Altamese Dilling, MD   0.5 mg at 06/23/15 1637  . metoprolol (LOPRESSOR) tablet 50 mg  50 mg Oral BID Milagros Loll, MD   50 mg at 06/26/15 1110  . ondansetron (ZOFRAN) tablet 4 mg  4 mg Oral Q6H PRN Crissie Figures, MD       Or  . ondansetron [facility]) injection 4 mg  4 mg Intravenous Q6H PRN Crissie Figures, MD      . polyethylene glycol (MIRALAX / GLYCOLAX) packet 17 g  17 g Oral Daily Crissie Figures, MD   17 g at 06/26/15 1106  . risperiDONE (RISPERDAL) tablet 3 mg  3 mg Oral Daily Milagros Loll, MD   3 mg at 06/26/15 1046  . sertraline (ZOLOFT) tablet 50 mg  50 mg Oral Daily Crissie Figures, MD   50 mg at 06/26/15 1046  . sevelamer carbonate (RENVELA) tablet 2,400 mg  2,400 mg Oral TID WC Crissie Figures, MD   2,400 mg at 06/26/15 1111  . sodium chloride 0.9 % injection 3 mL  3 mL Intravenous Q12H Crissie Figures, MD   3 mL at 06/26/15 1112  . vitamin B-12 (CYANOCOBALAMIN) tablet 1,000 mcg  1,000 mcg Oral Daily Crissie Figures, MD   1,000 mcg at 06/26/15 1046    Vital Signs: BP 113/38 mmHg  Pulse 93  Temp(Src) 98.7 F (37.1 C) (Oral)  Resp 16  Ht  (1.626 m)  Wt 61.145 kg (134 lb 12.8 oz)  BMI 23.13 kg/m2  SpO2 99% Filed Weights   06/25/15 0012 06/25/15 0536 06/26/15 0446  Weight: 61.462 kg (135 lb 8 oz) 61.372 kg (135 lb 4.8 oz) 61.145 kg (134 lb 12.8 oz)    Estimated body mass index is 23.13 kg/(m^2) as calculated from the following:   Height as of this encounter:  (1.626 m).   Weight as of this encounter: 61.145 kg (134 lb 12.8 oz).  PERFORMANCE STATUS (ECOG) : 3 -  Symptomatic, >50% confined to bed  PHYSICAL EXAM: Sleeping Easily aroused but is confused and not making sense with her speech on arousal.  She was not agitated, however EOMI OP clear Neck no JVD or Tm Hrt rrr no mgr Lungs cta anteriorly Abd soft and NT Ext no mottling    LABS: CBC:    Component Value Date/Time   WBC 6.8 06/23/2015 1718   WBC 6.7 10/22/2014 1056   HGB 9.3* 06/23/2015 1718   HGB 9.3* 10/22/2014 1056   HCT 28.9* 06/23/2015 1718   HCT 29.5* 10/22/2014 1056   PLT 178 06/23/2015 1718   PLT 182 10/22/2014 1056   MCV 88.7 06/23/2015 1718   MCV 84 10/22/2014 1056   NEUTROABS 4.8 10/22/2014 1056   LYMPHSABS 0.9* 10/22/2014 1056   MONOABS 0.7 10/22/2014 1056   EOSABS 0.2 10/22/2014 1056   BASOSABS 0.0 10/22/2014 1056   Comprehensive Metabolic Panel:    Component Value Date/Time   NA 139 06/23/2015 1718   NA 141 10/22/2014 1056   K 4.0 06/23/2015 1718   K 4.1 10/22/2014 1056   CL 98* 06/23/2015 1718   CL 102 10/22/2014 1056   CO2 30 06/23/2015 1718   CO2 31 10/22/2014 1056   BUN 48* 06/23/2015 1718   BUN 27* 10/22/2014 1056   CREATININE 8.92* 06/23/2015 1718   CREATININE 4.06* 10/22/2014 1056   GLUCOSE 81 06/23/2015 1718   GLUCOSE 104* 10/22/2014 1056   CALCIUM 8.4* 06/23/2015 1718   CALCIUM 8.0* 10/22/2014 1056   AST 18 06/20/2015 0058   AST 26 03/25/2014 1003   ALT 10* 06/20/2015 0058   ALT 17 03/25/2014 1003   ALKPHOS 50 06/20/2015 0058   ALKPHOS 49 03/25/2014 1003   BILITOT 0.8 06/20/2015 0058   BILITOT 0.3 03/25/2014 1003   PROT 6.5 06/20/2015 0058   PROT 6.3* 03/25/2014 1003   ALBUMIN 2.7* 06/23/2015 1718   ALBUMIN 2.4* 10/22/2014 1056    IMPRESSION: 1)  ESRD 2)  Advanced Dementia with Behavior Disturbance 3)  Resistance to care and HD recently  --improved very recently with sedating medications prior to HD  4)  Body Aches --original complaint but not complaining of this now 5)  Chest Pain at admission ---Vascular congestion at  admission has been treated with HD and meds and proper correction of electrolytes and pt has improved 6)  Hyperkalemia --treated 7)  Elevated Tropnonin --mild and due to demand ischemia 8)  HTN 9)  DM-2 10) Hearing Loss 11) Tobacco Smoking (chart says she is a current every day smoker)  12)  Constipation   See Near Top of Note for PLAN:   REFERRALS TO BE ORDERED:  None as yet   More than 50% of the visit was spent in counseling/coordination of care: Yes  Time Spent:70  minutes

## 2015-06-26 NOTE — Progress Notes (Signed)
Subjective:  Pt rather somnolent today. Apparently did well with last HD treatment.   Objective:  Vital signs in last 24 hours:  Temp:  [97.6 F (36.4 C)-98.7 F (37.1 C)] 98.6 F (37 C) (08/15 1215) Pulse Rate:  [85-93] 91 (08/15 1215) Resp:  [16-18] 18 (08/15 1215) BP: (113-131)/(38-60) 121/60 mmHg (08/15 1215) SpO2:  [95 %-99 %] 98 % (08/15 1215) Weight:  [61.145 kg (134 lb 12.8 oz)] 61.145 kg (134 lb 12.8 oz) (08/15 0446)  Weight change: -2.155 kg (-4 lb 12 oz) Filed Weights   06/25/15 0012 06/25/15 0536 06/26/15 0446  Weight: 61.462 kg (135 lb 8 oz) 61.372 kg (135 lb 4.8 oz) 61.145 kg (134 lb 12.8 oz)    Intake/Output: I/O last 3 completed shifts: In: -  Out: 2000 [Other:2000]     Physical Exam: General:  laying in the bed, lethargic   HEENT  anicteric sclera, moist mucous membranes   Neck  supple  Pulm/lungs  clear to auscultation anteriorly and laterally   CVS/Heart  S1S2 2/6 SEM  Abdomen:   soft, nontender, nondistended   Extremities:  no dependent and peripheral edema   Neurologic:  lethargic  Skin:  no acute rashes   Access:  PermCath        Basic Metabolic Panel:  Recent Labs Lab 06/20/15 0058 06/20/15 0541 06/21/15 1619 06/22/15 0428 06/23/15 1718  NA 138 139 138 141 139  K 6.0* 4.9 3.6 3.4* 4.0  CL 99* 99* 93* 98* 98*  CO2 17* 20* GLUCOSE 113* 89 86 92 81  BUN 97* 120* 75* 37* 48*  CREATININE 16.86* 17.32* 12.07* 7.14* 8.92*  CALCIUM 8.7* 9.1 8.1* 8.3* 8.4*  PHOS  --   --  6.1*  --  4.4     CBC:  Recent Labs Lab 06/20/15 0058 06/23/15 1718  WBC 9.0 6.8  HGB 10.1* 9.3*  HCT 31.3* 28.9*  MCV 89.1 88.7  PLT 172 178      Microbiology: Results for orders placed or performed during the hospital encounter of 06/19/15  MRSA PCR Screening     Status: None   Collection Time: 06/20/15 11:30 AM  Result Value Ref Range Status   MRSA by PCR NEGATIVE NEGATIVE Final    Comment:        The GeneXpert MRSA Assay  (FDA approved for NASAL specimens only), is one component of a comprehensive MRSA colonization surveillance program. It is not intended to diagnose MRSA infection nor to guide or monitor treatment for MRSA infections.     Coagulation Studies: No results for input(s): LABPROT, INR in the last 72 hours.  Urinalysis: No results for input(s): COLORURINE, LABSPEC, PHURINE, GLUCOSEU, HGBUR, BILIRUBINUR, KETONESUR, PROTEINUR, UROBILINOGEN, NITRITE, LEUKOCYTESUR in the last 72 hours.  Invalid input(s): APPERANCEUR    Imaging: No results found.   Medications:     . amLODipine  10 mg Oral Daily  . aspirin EC  81 mg Oral Daily  . clopidogrel  75 mg Oral Daily  . donepezil  5 mg Oral QHS  . epoetin (EPOGEN/PROCRIT) injection  4,000 Units Intravenous Q M,W,F-HD  . feeding supplement (NEPRO CARB STEADY)  237 mL Oral Q1500  . heparin  5,000 Units Subcutaneous 3 times per day  . hydrALAZINE  100 mg Oral 3 times per day  . insulin aspart  0-15 Units Subcutaneous TID WC  . insulin aspart  0-5 Units Subcutaneous QHS  . isosorbide mononitrate  120 mg Oral Daily  .  lisinopril  40 mg Oral Daily  . metoprolol tartrate  50 mg Oral BID  . polyethylene glycol  17 g Oral Daily  . [START ON 06/28/2015] risperiDONE  3 mg Oral Once per day on Mon Wed Fri  . sertraline  50 mg Oral Daily  . sevelamer carbonate  2,400 mg Oral TID WC  . sodium chloride  3 mL Intravenous Q12H  . vitamin B-12  1,000 mcg Oral Daily   acetaminophen **OR** acetaminophen, diazepam, LORazepam, ondansetron **OR** ondansetron (ZOFRAN) IV  Assessment/ Plan:  81 y.o. female with end-stage renal disease, hypertension, dementia, diabetes  1. End-stage renal disease with Hyperkalemia. Patient is not currently enrolled in chronic dialysis program because of lack of family support. Patient would not get on the machine without her great niece being present there.  She would cause disruption in the treatment of other dialysis  patient is therefore she was discharged from the outpatient dialysis unit. Patient's great niece has some family issues due to which she is not able to sit with the patient.  Socially, it is a very difficult situation. Patient will need some kind of chemical restraint to be able to get through dialysis treatment without agitation.  - Last dialysis session went well with meds.   Will plan for HD again tomorrow.   Will also need to discuss further with care management.   2. Anemia of chronic kidney disease. Continue epogen 4000 units IV with HD.  3. Secondary hyperparathyroidism: continue renvela 2400mg  po tid/wm.  4. Hypoalbuminemia: continue nepro.   5. Hypertension: BP 121/60  - continue amlodipine, hydralazine, lisinopril, metoprolol.   LOS: 6 Nivedita Mirabella 8/15/20165:43 PM

## 2015-06-26 NOTE — Progress Notes (Signed)
[facility] - [place] at [facility]   PATIENT NAME: Caroline Flores    MR#:  [id number]  DATE OF BIRTH:  November 01, 1920  SUBJECTIVE:  CHIEF COMPLAINT:   Chief Complaint  Patient presents with  . Generalized Body Aches   No concerns. Drowzy in the morning. More awake and eating now. Has been calm all thru her hospital stay without any agitation. She does have dementia causing confusion at times . REVIEW OF SYSTEMS:  Pt is not able to tell. Confused. Negative for all ROS  Review of Systems  Constitutional: Negative for fever and chills.  HENT: Negative for sore throat.   Eyes: Negative for blurred vision, double vision and pain.  Respiratory: Negative for cough, hemoptysis, shortness of breath and wheezing.   Cardiovascular: Negative for chest pain, palpitations, orthopnea and leg swelling.  Gastrointestinal: Negative for heartburn, nausea, vomiting, abdominal pain, diarrhea and constipation.  Genitourinary: Negative for dysuria and hematuria.  Musculoskeletal: Negative for back pain and joint pain.  Skin: Negative for rash.  Neurological: Negative for sensory change, speech change, focal weakness and headaches.  Endo/Heme/Allergies: Does not bruise/bleed easily.  Psychiatric/Behavioral: Negative for depression. The patient is not nervous/anxious.     DRUG ALLERGIES:  No Known Allergies  VITALS:  Blood pressure 113/38, pulse 93, temperature 98.7 F (37.1 C), temperature source Oral, resp. rate 16, height  (1.626 m), weight 61.145 kg (134 lb 12.8 oz), SpO2 99 %.  PHYSICAL EXAMINATION:  GENERAL:  81 y.o.-year-old patient lying in the bed with no acute distress.  EYES: Pupils equal, round, reactive to light and accommodation. No scleral icterus. Extraocular muscles intact.  HEENT: Head atraumatic, normocephalic. Oropharynx and nasopharynx clear.  NECK:  Supple, no jugular venous distention. No thyroid enlargement, no tenderness.  LUNGS: Normal breath  sounds bilaterally, no wheezing, positive crepitation. No use of accessory muscles of respiration.  CARDIOVASCULAR: S1, S2 normal. No murmurs, rubs, or gallops.  ABDOMEN: Soft, nontender, nondistended. Bowel sounds present. No organomegaly or mass.  EXTREMITIES: No pedal edema, cyanosis, or clubbing.  NEUROLOGIC: Cranial nerves II through XII are intact. Muscle strength 3-4 /5 in all extremities. Not able to check further- as she is confused and not very co-operative. PSYCHIATRIC: The patient is confused and somewhat agitated. SKIN: No obvious rash, lesion, or ulcer.   Physical Exam LABORATORY PANEL:   CBC  Recent Labs Lab 06/23/15 1718  WBC 6.8  HGB 9.3*  HCT 28.9*  PLT 178   ------------------------------------------------------------------------------------------------------------------  Chemistries   Recent Labs Lab 06/20/15 0058  06/23/15 1718  NA 138  < > 139  K 6.0*  < > 4.0  CL 99*  < > 98*  CO2 17*  < > 30  GLUCOSE 113*  < > 81  BUN 97*  < > 48*  CREATININE 16.86*  < > 8.92*  CALCIUM 8.7*  < > 8.4*  AST 18  --   --   ALT 10*  --   --   ALKPHOS 50  --   --   BILITOT 0.8  --   --   < > = values in this interval not displayed. ------------------------------------------------------------------------------------------------------------------  Cardiac Enzymes  Recent Labs Lab 06/20/15 0541 06/20/15 1739  TROPONINI 0.08* 0.09*   ------------------------------------------------------------------------------------------------------------------  RADIOLOGY:  No results found.  ASSESSMENT AND PLAN:    * End-stage renal disease on hemodialysis-Tuesday/Thursday/Saturday. Missed HD all last week cause she refused it. Patient unable to make own decisions   Nephrology consult to help with  HD here.  * Hyperkalemia secondary to end-stage renal disease.  Resolved  * Elevated troponin is minimal in setting of ESRD. Insignificant  * uremic encephalopathy -  Resolved.  * Dementia Start risperidone and valium on HD days in AM. Appreciate psychiatry help  * Hypertension, stable on home medications. * Diabetes mellitus type 2 stable clinically.  All the records are reviewed and case discussed with Care Management/Social Workerr. Management plans discussed with the patient, family and they are in agreement.  CODE STATUS: full.  TOTAL TIME TAKING CARE OF THIS PATIENT:35 minutes.   Discussed with social worker and still waiting for Nephrology and Dialysis center to accept patient. Will D/C when they confirm this   Orie Fisherman M.D on 06/26/2015   Between 7am to 6pm - Pager - [phone]  After 6pm go to [email or web address] - password [email or web address]  [facility]  Office  [phone]  CC: Primary care physician; Pcp Not In System

## 2015-06-27 LAB — RENAL FUNCTION PANEL
ANION GAP: 9 (ref 5–15)
Albumin: 2.5 g/dL — ABNORMAL LOW (ref 3.5–5.0)
BUN: 39 mg/dL — ABNORMAL HIGH (ref 6–20)
CHLORIDE: 98 mmol/L — AB (ref 101–111)
CO2: 32 mmol/L (ref 22–32)
CREATININE: 6.9 mg/dL — AB (ref 0.44–1.00)
Calcium: 8.4 mg/dL — ABNORMAL LOW (ref 8.9–10.3)
GFR, EST AFRICAN AMERICAN: 5 mL/min — AB (ref >60)
GFR, EST NON AFRICAN AMERICAN: 5 mL/min — AB (ref >60)
Glucose, Bld: 228 mg/dL — ABNORMAL HIGH (ref 65–99)
POTASSIUM: 4.7 mmol/L (ref 3.5–5.1)
Phosphorus: 3.5 mg/dL (ref 2.5–4.6)
Sodium: 139 mmol/L (ref 135–145)

## 2015-06-27 LAB — GLUCOSE, CAPILLARY
GLUCOSE-CAPILLARY: 231 mg/dL — AB (ref 65–99)
GLUCOSE-CAPILLARY: 197 mg/dL — AB (ref 65–99)
Glucose-Capillary: 124 mg/dL — ABNORMAL HIGH (ref 65–99)
Glucose-Capillary: 115 mg/dL — ABNORMAL HIGH (ref 65–99)

## 2015-06-27 LAB — CBC
HEMATOCRIT: 27 % — AB (ref 35.0–47.0)
HEMOGLOBIN: 8.6 g/dL — AB (ref 12.0–16.0)
MCH: 28.2 pg (ref 26.0–34.0)
MCHC: 31.8 g/dL — ABNORMAL LOW (ref 32.0–36.0)
MCV: 88.9 fL (ref 80.0–100.0)
PLATELETS: 202 10*3/uL (ref 150–440)
RBC: 3.03 MIL/uL — AB (ref 3.80–5.20)
RDW: 15.8 % — ABNORMAL HIGH (ref 11.5–14.5)
WBC: 6 10*3/uL (ref 3.6–11.0)

## 2015-06-27 MED ORDER — DIAZEPAM 2 MG PO TABS: 5.0000 mg | ORAL_TABLET | ORAL | Status: DC

## 2015-06-27 MED ORDER — EPOETIN ALFA 10000 UNIT/ML IJ SOLN
4000.0000 [IU] | INTRAMUSCULAR | Status: DC
  Administered 2015-06-27: 4000 [IU] via INTRAVENOUS

## 2015-06-27 MED ORDER — HYDRALAZINE HCL 50 MG PO TABS
50.0000 mg | ORAL_TABLET | Freq: Three times a day (TID) | ORAL | Status: DC
  Administered 2015-06-27: 50 mg via ORAL
  Filled 2015-06-27 (×2): qty 1

## 2015-06-27 MED ORDER — RISPERIDONE 2 MG PO TABS: 2.0000 mg | ORAL_TABLET | ORAL | Status: DC

## 2015-06-27 MED ORDER — RISPERIDONE 1 MG PO TABS
2.0000 mg | ORAL_TABLET | ORAL | Status: DC
  Administered 2015-06-27: 2 mg via ORAL
  Filled 2015-06-27: qty 2

## 2015-06-27 NOTE — Care Management Note (Signed)
I just received message from Dr. Cherylann Ratel on possible acceptance of patient back to [facility] [address]  He wants to see how patient does with dialysis again today in the hospital.  He also voiced a concern that in the past SNF was not willing to administer medication to help calm patient prior to going to dialysis.  I am reaching out to [place] CSW so that this piece can resolved prior to discharge. Ivor Reining  Dialysis Liaison  [phone]

## 2015-06-27 NOTE — Clinical Social Work Note (Signed)
Late entry by CSW for conversation with Caroline Flores, Social Worker with Caroline Flores. CSW informed in progression yesterday that patient was lethargic and not communicating much. CSW requested PT be consulted to see how patient's mobility was. It was CSW's understanding that patient had possibly been ambulatory at the dialysis center but in fact after speaking with Caroline Flores at [place], patient was wheelchair bound but could wheel herself all over the center and outside the center. Caroline Flores explained that the [facility] would drop patient off in the lobby and then patient would proceed to wheel herself outside of the facility and into the road, etc. Caroline Flores stated that patient would also try to run into people with her wheelchair and once hooked up to the dialysis machine would attempt to wheel away. Thus the dialysis center wanted a sitter with patient. [name] was able to sit with patient for a couple of weeks then had a family emergency where she had to keep her grandchildren and could no longer sit with patient and could not provide a Comptroller. Caroline Flores informed CSW that allegedly Dr. Thedore Mins with nephrology spoke with patient's niece and stated that if patient was in need of further dialysis that [facility] could bring patient to the [facility] ED for treatment.  CSW spoke with Dr. Cherylann Ratel, with nephrology, and he informed that patient has not had any behavioral issues here and that if her behaviors could be medically managed during dialysis that they would take patient back in the center. Dr. Cherylann Ratel stated he would see patient today and let CSW know.  York Spaniel MSW,LCSW [phone]

## 2015-06-27 NOTE — Progress Notes (Signed)
Hemodialysis start 

## 2015-06-27 NOTE — Progress Notes (Signed)
Palliative Medicine Inpatient Consult Follow Up Note   Name: Caroline Flores Date: 06/27/2015 MRN: [id number]  DOB: 1919/11/27  Referring Physician: Milagros Loll, MD  Palliative Care consult requested for this 81 y.o. female for goals of medical therapy in patient with End Stage Renal Disease and Very Advanced Dementia with a Behavior Disturbance.  TODAY'S CONVERSATIONS AND PLAN:  1)  I called and today was able to reach pt's niece (who makes decisions for pt:  Caroline Flores  # [phone]).  She confirms that she does very much want her aunt to be FULL CODE . She says she just talked about this last Monday at the Care Planning Meeting at the facility. I informed her that each time a patient goes to another facility or hospital, that we should talk about it again. She said she understands this but feels very strongly about 'NO DNR' being ordered.  2)  Niece confirms she wants HD to continue and is glad that pt is doing better recently on medications.   3)  I will sign off now, as there is no more indication for ongoing conversations with the niece related to palliative approaches to care for this patient. The patient's niece has not wavered at all in her conversations with others about continuing aggressive care and now she also confirms that FULL CODE status is not a status of default --it is a very deliberately chosen Full Code status. Niece wants everything possible done in all circumstances and related that she feels very strongly about this.     REVIEW OF SYSTEMS:  Patient is not able to provide ROS  CODE STATUS: Full code  --RECONFIRMED FULL CODE WISHES TODAY (during discussion by phone with pts niece)   PAST MEDICAL HISTORY: Past Medical History  Diagnosis Date  . HTN (hypertension)   . Diabetes mellitus, type II   . Hearing loss   . Memory loss   . Chronic kidney disease (CKD), stage V     GFR 2  . Dementia     PAST SURGICAL HISTORY:  Past Surgical History  Procedure  Laterality Date  . Back surgery      Vital Signs: BP 115/41 mmHg  Pulse 67  Temp(Src) 98.6 F (37 C) (Oral)  Resp 18  Ht  (1.626 m)  Wt 65.862 kg (145 lb 3.2 oz)  BMI 24.91 kg/m2  SpO2 95% Filed Weights   06/26/15 0446 06/27/15 0203 06/27/15 0500  Weight: 61.145 kg (134 lb 12.8 oz) 65.862 kg (145 lb 3.2 oz) 65.862 kg (145 lb 3.2 oz)    Estimated body mass index is 24.91 kg/(m^2) as calculated from the following:   Height as of this encounter:  (1.626 m).   Weight as of this encounter: 65.862 kg (145 lb 3.2 oz).  PHYSICAL EXAM: Awake Calm Heart rrr no mgr Lungs cta anteriorly Abd soft and NT Skin warm and dry  LABS: CBC:    Component Value Date/Time   WBC 6.8 06/23/2015 1718   WBC 6.7 10/22/2014 1056   HGB 9.3* 06/23/2015 1718   HGB 9.3* 10/22/2014 1056   HCT 28.9* 06/23/2015 1718   HCT 29.5* 10/22/2014 1056   PLT 178 06/23/2015 1718   PLT 182 10/22/2014 1056   MCV 88.7 06/23/2015 1718   MCV 84 10/22/2014 1056   NEUTROABS 4.8 10/22/2014 1056   LYMPHSABS 0.9* 10/22/2014 1056   MONOABS 0.7 10/22/2014 1056   EOSABS 0.2 10/22/2014 1056   BASOSABS 0.0 10/22/2014 1056   Comprehensive  Metabolic Panel:    Component Value Date/Time   NA 139 06/23/2015 1718   NA 141 10/22/2014 1056   K 4.0 06/23/2015 1718   K 4.1 10/22/2014 1056   CL 98* 06/23/2015 1718   CL 102 10/22/2014 1056   CO2 30 06/23/2015 1718   CO2 31 10/22/2014 1056   BUN 48* 06/23/2015 1718   BUN 27* 10/22/2014 1056   CREATININE 8.92* 06/23/2015 1718   CREATININE 4.06* 10/22/2014 1056   GLUCOSE 81 06/23/2015 1718   GLUCOSE 104* 10/22/2014 1056   CALCIUM 8.4* 06/23/2015 1718   CALCIUM 8.0* 10/22/2014 1056   AST 18 06/20/2015 0058   AST 26 03/25/2014 1003   ALT 10* 06/20/2015 0058   ALT 17 03/25/2014 1003   ALKPHOS 50 06/20/2015 0058   ALKPHOS 49 03/25/2014 1003   BILITOT 0.8 06/20/2015 0058   BILITOT 0.3 03/25/2014 1003   PROT 6.5 06/20/2015 0058   PROT 6.3* 03/25/2014 1003    ALBUMIN 2.7* 06/23/2015 1718   ALBUMIN 2.4* 10/22/2014 1056    IMPRESSION: 1) ESRD 2) Advanced Dementia with Behavior Disturbance --improved behavours on medications 3) Resistance to care and HD recently  --improved very recently with sedating medications prior to HD  4) Body Aches --original complaint but not complaining of this now 5) Chest Pain at admission ---Vascular congestion at admission has been treated with HD and meds and proper correction of electrolytes and pt has improved 6) Hyperkalemia --treated 7) Elevated Tropnonin --mild and due to demand ischemia 8) HTN 9) DM-2 10) Hearing Loss 11) Tobacco Smoking (chart says she is a current every day smoker)  12) Constipation   See top of note for PLAN   More than 50% of the visit was spent in counseling/coordination of care: YES  Time Spent: 25 min

## 2015-06-27 NOTE — Progress Notes (Signed)
Post hd tx 

## 2015-06-27 NOTE — Progress Notes (Signed)
Pre-hd tx 

## 2015-06-27 NOTE — Evaluation (Signed)
Physical Therapy Evaluation Patient Details Name: Caroline Flores MRN: [id number] DOB: [birth year] Today's Date: 06/27/2015   History of Present Illness  Caroline Flores is a 81 y.o. female with a known history of end-stage renal disease on hemodialysis-Tuesday/Thursday/Saturday, hypertension, diabetes mellitus, dementia, nursing home resident brought into the emergency room with the complaints of vague chest pain and body aches. According to the patient's grand niece who is with the patient at this time, patient did not have her hemodialysis all last week since no attendant available to go along with the patient and last night nursing home staff mentioned that the patient complained of body aches and chest pain, hence brought to the emergency room. Patient was evaluated by the ED physician and was found to be with stable vitals, comfortably resting with no complaints. Workup revealed elevated progression of 6.0, BUN/creatinine of 97/16.8, troponin 0.09. EKG sinus bradycardia with ventricular rate of 52 bpm, no acute ischemic changes. Chest x-ray vascular congestion. Patient was given soda bicarbonate, insulin and dextrose, calcium gluconate and Kayexalate and hospitalist service was consulted for further management. At time of evaluation pt is AOx1 and unable to provide meaningful history. No family members present so history obtained from chart. Apparently pt is wheelchair bound at baseline but is able to self-propel wheelchair for limited distances. Pt is pleasant today however during prior attempt pt was agitated and uncooperative.  Clinical Impression  Pt is not agitated with therapist today and pleasantly agrees to participate with evaluation. However pt with severe dementia resulting in inability to follow commands consistently to participate with therapy. Pt requiring maxA for bed mobility and transfers and is unable to come upright to standing. At baseline however pt is wheelchair bout and  according to medical record is able to propel herself adequately for limited mobility. Pt is not appropriate for PT due to her inability to participate, intermittent agitation, and current status appearing to be baseline for patient. Order will be completed. Please enter new order if status changes or needs arise.     Follow Up Recommendations Supervision/Assistance - 24 hour;No PT follow up    Equipment Recommendations  None recommended by PT    Recommendations for Other Services       Precautions / Restrictions Precautions Precautions: Fall Restrictions Weight Bearing Restrictions: No      Mobility  Bed Mobility Overal bed mobility: Needs Assistance;+2 for physical assistance Bed Mobility: Supine to Sit     Supine to sit: Max assist     General bed mobility comments: MaxA+2 to scoot up in bed. Pt requires hand over hand assistance and heavy tactile cues to go from supine<>sit  Transfers Overall transfer level: Needs assistance Equipment used: Rolling walker (2 wheeled) Transfers: Sit to/from Stand Sit to Stand: Max assist         General transfer comment: MaxA+1 to attempt sit to stand with pt. Pt unable to come to full standing and requires heavy cues for safety and hand placement. Pt does not demonstrate sufficient strength to stand. She is wheelchair bound at baseline  Ambulation/Gait             General Gait Details: Unable to attempt at this time  Stairs            Wheelchair Mobility    Modified Rankin (Stroke Patients Only)       Balance Overall balance assessment: Needs assistance Sitting-balance support: Feet supported;No upper extremity supported Sitting balance-Leahy Scale: Fair     Standing balance support:  Bilateral upper extremity supported Standing balance-Leahy Scale: Poor                               Pertinent Vitals/Pain Pain Assessment: Faces Faces Pain Scale: Hurts whole lot Pain Location: L heel with  movement in the bed. Inspected and healing pressure ulcer noted. RN notified Pain Intervention(s): Repositioned    Home Living Family/patient expects to be discharged to:: Unsure                      Prior Function Level of Independence: Needs assistance   Gait / Transfers Assistance Needed: Wheechair bound           Hand Dominance        Extremity/Trunk Assessment   Upper Extremity Assessment: Generalized weakness;Difficult to assess due to impaired cognition           Lower Extremity Assessment: Generalized weakness;Difficult to assess due to impaired cognition         Communication   Communication: HOH  Cognition Arousal/Alertness: Awake/alert Behavior During Therapy: WFL for tasks assessed/performed Overall Cognitive Status: History of cognitive impairments - at baseline Area of Impairment: Orientation;Following commands Orientation Level: Disoriented to;Place;Time;Situation   Memory: Decreased short-term memory Following Commands: Follows one step commands inconsistently       General Comments: Pt is thoroughly confused throughout session. Repeats questions multiple times. Unable to follow simple commands consistently    General Comments      Exercises        Assessment/Plan    PT Assessment Patent does not need any further PT services  PT Diagnosis Generalized weakness   PT Problem List    PT Treatment Interventions     PT Goals (Current goals can be found in the Care Plan section) Acute Rehab PT Goals PT Goal Formulation: All assessment and education complete, DC therapy    Frequency     Barriers to discharge        Co-evaluation               End of Session Equipment Utilized During Treatment: Gait belt Activity Tolerance: Other (comment) (Limited by confusion) Patient left: in bed;with bed alarm set;with nursing/sitter in room Nurse Communication: Mobility status (Pressure sore L heel)         Time:  1610-9604 PT Time Calculation (min) (ACUTE ONLY): 15 min   Charges:   PT Evaluation $Initial PT Evaluation Tier I: 1 Procedure     PT G Codes:       Sharalyn Ink Huprich PT, DPT   Huprich,Jason 06/27/2015, 10:28 AM

## 2015-06-27 NOTE — Care Management Note (Signed)
Please see my notes from 06/20/15 and 06/23/15.  CSW's and I have been following this patient closely since admission with concerns for patients safety at the dialysis center and safety of others while at at the center.  Notes at this time indicate that current medication administered prior to dialysis treatment seem to be allowing patient to calmly receive her treatments in the hospital.  Readmission records have been sent to [facility] [address]in anticipation of a return to outpatient dialysis with medication prior to arrival to center.  Will follow up with Nephrology today to see if they feel patient is candidate for outpatient dialysis. Ivor Reining  Dialysis Liaison  [phone]

## 2015-06-27 NOTE — Discharge Instructions (Signed)
°  DIET:  °Renal diet ° °DISCHARGE CONDITION:  °Stable ° °ACTIVITY:  °Activity as tolerated ° °OXYGEN:  °Home Oxygen: No. °  °Oxygen Delivery: room air ° °DISCHARGE LOCATION:  °nursing home  ° °If you experience worsening of your admission symptoms, develop shortness of breath, life threatening emergency, suicidal or homicidal thoughts you must seek medical attention immediately by calling 911 or calling your MD immediately  if symptoms less severe. ° °You Must read complete instructions/literature along with all the possible adverse reactions/side effects for all the Medicines you take and that have been prescribed to you. Take any new Medicines after you have completely understood and accpet all the possible adverse reactions/side effects.  ° °Please note ° °You were cared for by a hospitalist during your hospital stay. If you have any questions about your discharge medications or the care you received while you were in the hospital after you are discharged, you can call the unit and asked to speak with the hospitalist on call if the hospitalist that took care of you is not available. Once you are discharged, your primary care physician will handle any further medical issues. Please note that NO REFILLS for any discharge medications will be authorized once you are discharged, as it is imperative that you return to your primary care physician (or establish a relationship with a primary care physician if you do not have one) for your aftercare needs so that they can reassess your need for medications and monitor your lab values. ° ° ° °

## 2015-06-27 NOTE — Progress Notes (Signed)
[facility] - [place] at [facility]   PATIENT NAME: Caroline Flores    MR#:  [id number]  DATE OF BIRTH:  1920-08-01  SUBJECTIVE:  CHIEF COMPLAINT:   Chief Complaint  Patient presents with  . Generalized Body Aches   No concerns. Calm. No issues per nursing staff Patient has done well with dialysis in hospital REVIEW OF SYSTEMS:  Pt is not able to tell. Confused. Negative for all ROS  Review of Systems  Constitutional: Negative for fever and chills.  HENT: Negative for sore throat.   Eyes: Negative for blurred vision, double vision and pain.  Respiratory: Negative for cough, hemoptysis, shortness of breath and wheezing.   Cardiovascular: Negative for chest pain, palpitations, orthopnea and leg swelling.  Gastrointestinal: Negative for heartburn, nausea, vomiting, abdominal pain, diarrhea and constipation.  Genitourinary: Negative for dysuria and hematuria.  Musculoskeletal: Negative for back pain and joint pain.  Skin: Negative for rash.  Neurological: Negative for sensory change, speech change, focal weakness and headaches.  Endo/Heme/Allergies: Does not bruise/bleed easily.  Psychiatric/Behavioral: Negative for depression. The patient is not nervous/anxious.     DRUG ALLERGIES:  No Known Allergies  VITALS:  Blood pressure 102/66, pulse 66, temperature 98.3 F (36.8 C), temperature source Axillary, resp. rate 18, height 5\' 4"  (1.626 m), weight 65.862 kg (145 lb 3.2 oz), SpO2 98 %.  PHYSICAL EXAMINATION:  GENERAL:  81 y.o.-year-old patient lying in the bed with no acute distress.  EYES: Pupils equal, round, reactive to light and accommodation. No scleral icterus. Extraocular muscles intact.  HEENT: Head atraumatic, normocephalic. Oropharynx and nasopharynx clear.  NECK:  Supple, no jugular venous distention. No thyroid enlargement, no tenderness.  LUNGS: Normal breath sounds bilaterally, no wheezing, positive crepitation. No use of accessory  muscles of respiration.  CARDIOVASCULAR: S1, S2 normal. No murmurs, rubs, or gallops.  ABDOMEN: Soft, nontender, nondistended. Bowel sounds present. No organomegaly or mass.  EXTREMITIES: No pedal edema, cyanosis, or clubbing.  NEUROLOGIC: Cranial nerves II through XII are intact. Muscle strength 3-4 /5 in all extremities. Not able to check further- as she is confused and not very co-operative. PSYCHIATRIC: The patient is confused and somewhat agitated. SKIN: No obvious rash, lesion, or ulcer.   Physical Exam LABORATORY PANEL:   CBC  Recent Labs Lab 06/23/15 1718  WBC 6.8  HGB 9.3*  HCT 28.9*  PLT 178   ------------------------------------------------------------------------------------------------------------------  Chemistries   Recent Labs Lab 06/23/15 1718  NA 139  K 4.0  CL 98*  CO2 30  GLUCOSE 81  BUN 48*  CREATININE 8.92*  CALCIUM 8.4*   ------------------------------------------------------------------------------------------------------------------  Cardiac Enzymes  Recent Labs Lab 06/20/15 1739  TROPONINI 0.09*   ------------------------------------------------------------------------------------------------------------------  RADIOLOGY:  No results found.  ASSESSMENT AND PLAN:    * End-stage renal disease on hemodialysis-Tuesday/Thursday/Saturday. Missed HD all last week cause she refused it. Patient unable to make own decisions   Nephrology consult to help with HD here.  * Hyperkalemia secondary to end-stage renal disease.  Resolved  * Elevated troponin is minimal in setting of ESRD. Insignificant  * uremic encephalopathy - Resolved.  * Dementia Started risperidone and valium on HD days in AM. Appreciate psychiatry help  * Hypertension, stable on home medications. * Diabetes mellitus type 2 stable clinically.  All the records are reviewed and case discussed with Care Management/Social Workerr. Management plans discussed with the  patient, family and they are in agreement.  CODE STATUS: full.  TOTAL TIME TAKING CARE OF THIS PATIENT:35 minutes.  D/C to SNF later today if patient cleared by nephrology for OP dialysis   Orie Fisherman M.D on 06/27/2015   Between 7am to 6pm - Pager - [phone]  After 6pm go to [email or web address] - password [email or web address]  [facility]  Office  [phone]  CC: Primary care physician; Pcp Not In System

## 2015-06-27 NOTE — Progress Notes (Signed)
Per Dr. Cherylann Ratel patient can return to [facility] on [address] without a sitter if [facility] is agreeable to giving patient medication before dialysis treatment to keep her calm. Dr. Cherylann Ratel also reported that patient is very sedated today and he would like for psych to see patient again to adjust medications before she discharges. The last time psych saw patient was on 06/22/15. CSW also contacted Dr. Elpidio Anis and made him aware of above. Dr. Elpidio Anis ordered psych consult per verbal order. Charge RN put in verbal psych order. CSW also contacted Debbie at [facility] and made her aware of above. Per Debbie patient can come on Thursday at 11:20 am. CSW contacted [facility] admissions coordinator at [facility] and made her aware of above. Per Gavin Pound patient can return and they will need prescriptions from the MD for the medications that need to be given before dialysis. CSW also contacted patient's guardian Verlon Au and made her aware of above. Patient will likely D/C to [facility] tomorrow pending psych consult. CSW attempted to meet with patient however she was asleep. CSW will continue to follow and assist as needed.   Jetta Lout, LCSWA [phone]

## 2015-06-27 NOTE — Progress Notes (Signed)
This note also relates to the following rows which could not be included: BP - Cannot attach notes to unvalidated device data   Hemodialysis end.

## 2015-06-27 NOTE — Progress Notes (Signed)
Subjective:  Pt seen during HD.  Shes is resting comfortably and not disruptive.  Vitals stable at present.   BFR 400 at the moment.    Objective:  Vital signs in last 24 hours:  Temp:  [97.7 F (36.5 C)-98.6 F (37 C)] 97.7 F (36.5 C) (08/16 1300) Pulse Rate:  [58-83] 71 (08/16 1400) Resp:  [16-18] 18 (08/16 1400) BP: (102-138)/(41-66) 138/56 mmHg (08/16 1400) SpO2:  [95 %-100 %] 98 % (08/16 1400) Weight:  [65.862 kg (145 lb 3.2 oz)-66.7 kg (147 lb 0.8 oz)] 66.7 kg (147 lb 0.8 oz) (08/16 1300)  Weight change: 4.717 kg (10 lb 6.4 oz) Filed Weights   06/27/15 0203 06/27/15 0500 06/27/15 1300  Weight: 65.862 kg (145 lb 3.2 oz) 65.862 kg (145 lb 3.2 oz) 66.7 kg (147 lb 0.8 oz)    Intake/Output: I/O last 3 completed shifts: In: 3 [I.V.:3] Out: 1 [Stool:1]     Physical Exam: General:  laying in the bed, resting comfortably  HEENT  anicteric sclera, moist mucous membranes   Neck  supple  Pulm/lungs  clear to auscultation anteriorly and laterally   CVS/Heart  S1S2 2/6 SEM  Abdomen:   soft, nontender, nondistended   Extremities:  no dependent and peripheral edema   Neurologic:  resting comfortably  Skin:  no acute rashes   Access:  PermCath        Basic Metabolic Panel:  Recent Labs Lab 06/21/15 1619 06/22/15 0428 06/23/15 1718  NA 138 141 139  K 3.6 3.4* 4.0  CL 93* 98* 98*  CO2 25 30 30   GLUCOSE 86 92 81  BUN 75* 37* 48*  CREATININE 12.07* 7.14* 8.92*  CALCIUM 8.1* 8.3* 8.4*  PHOS 6.1*  --  4.4     CBC:  Recent Labs Lab 06/23/15 1718 06/27/15 1407  WBC 6.8 6.0  HGB 9.3* 8.6*  HCT 28.9* 27.0*  MCV 88.7 88.9  PLT 178 202      Microbiology: Results for orders placed or performed during the hospital encounter of 06/19/15  MRSA PCR Screening     Status: None   Collection Time: 06/20/15 11:30 AM  Result Value Ref Range Status   MRSA by PCR NEGATIVE NEGATIVE Final    Comment:        The GeneXpert MRSA Assay (FDA approved for NASAL  specimens only), is one component of a comprehensive MRSA colonization surveillance program. It is not intended to diagnose MRSA infection nor to guide or monitor treatment for MRSA infections.     Coagulation Studies: No results for input(s): LABPROT, INR in the last 72 hours.  Urinalysis: No results for input(s): COLORURINE, LABSPEC, PHURINE, GLUCOSEU, HGBUR, BILIRUBINUR, KETONESUR, PROTEINUR, UROBILINOGEN, NITRITE, LEUKOCYTESUR in the last 72 hours.  Invalid input(s): APPERANCEUR    Imaging: No results found.   Medications:     . amLODipine  10 mg Oral Daily  . aspirin EC  81 mg Oral Daily  . clopidogrel  75 mg Oral Daily  . donepezil  5 mg Oral QHS  . epoetin (EPOGEN/PROCRIT) injection  4,000 Units Intravenous Q T,Th,Sa-HD  . feeding supplement (NEPRO CARB STEADY)  237 mL Oral Q1500  . heparin  5,000 Units Subcutaneous 3 times per day  . hydrALAZINE  50 mg Oral 3 times per day  . insulin aspart  0-15 Units Subcutaneous TID WC  . insulin aspart  0-5 Units Subcutaneous QHS  . isosorbide mononitrate  120 mg Oral Daily  . lisinopril  40 mg Oral Daily  .  metoprolol tartrate  50 mg Oral BID  . polyethylene glycol  17 g Oral Daily  . risperiDONE  2 mg Oral Once per day on Mon Wed Fri  . sertraline  50 mg Oral Daily  . sevelamer carbonate  2,400 mg Oral TID WC  . sodium chloride  3 mL Intravenous Q12H  . vitamin B-12  1,000 mcg Oral Daily   acetaminophen **OR** acetaminophen, diazepam, LORazepam, ondansetron **OR** ondansetron (ZOFRAN) IV  Assessment/ Plan:  81 y.o. female with end-stage renal disease, hypertension, dementia, diabetes  1. End-stage renal disease with Hyperkalemia. Patient is not currently enrolled in chronic dialysis program because of lack of family support. Patient would not get on the machine without her great niece being present there.  She would cause disruption in the treatment of other dialysis patient is therefore she was discharged from the  outpatient dialysis unit. Patient's great niece has some family issues due to which she is not able to sit with the patient.  Socially, it is a very difficult situation. Patient will need some kind of chemical restraint to be able to get through dialysis treatment without agitation.  - Pt seen during dialysis treatment today at 2:30PM, pt tolerating well, BFR 400, UF target 2kg.  Discussed plan with care team, it appears she will be discharged if NH agreeable to giving patients meds prior to dialysis.    2. Anemia of chronic kidney disease. Hgb 8.6, continue epogen 4000 units IV with HD.  3. Secondary hyperparathyroidism: continue renvela  po tid/wm, most recent phos 3.5.  4. Hypoalbuminemia: continue nepro, will likely need some supplementation as outpt as well.  5. Hypertension: BP 138/56 and well controlled. - continue amlodipine, hydralazine, lisinopril, metoprolol.   LOS: 7 Maelle Sheaffer 8/16/20162:33 PM

## 2015-06-27 NOTE — Progress Notes (Signed)
Nutrition Follow-up   INTERVENTION:   Meals and Snacks: Cater to patient preferences; Provide encouragement at meal times top optimize intake. Pt may benefit from liberalizing diet order to regular. Medical Food Supplement Therapy: Continue Mighty shakes as ordered as pt drinking well this am and Magic Cup as pt eating ice cream with medicines this am with Nsg, Continue Nepro at this time as well.   NUTRITION DIAGNOSIS:   Inadequate oral intake related to poor appetite as evidenced by meal completion < 25%, continues however pt drinking 100% of supplement this am with Nsg  GOAL:   Patient will meet greater than or equal to 90% of their needs; ongoing  MONITOR:    (Energy Intake, Anthropometrics, Digestive System, Electrolyte/Renal Profile)  ASSESSMENT:    Pt scheduled for HD this afternoon.   Diet Order:  Diet - low sodium heart healthy Diet renal/carb modified with fluid restriction Diet-HS Snack?: Nothing; Room service appropriate?: Yes; Fluid consistency:: Thin    Current Nutrition: Pt ate 25% of pancakes and grits with RN [place] this am and 100% of Strawberry Mighty Shake. Pt had eaten 50% of ice cream with medications this am as well. Per documentation pt ate bites of breakfast yesterday, 10-60% of lunch and no documentation of dinner. PO intake generally <25% of meals, however pt drank supplement well this am. Per [employer] pt needing a lot of encouragement during meal times.   Gastrointestinal Profile: Last BM: 06/27/2015   Medications: Novolog, Miralax, Renvela, vitamin B12  Electrolyte/Renal Profile and Glucose Profile:   Recent Labs Lab 06/21/15 1619 06/22/15 0428 06/23/15 1718  NA 138 141 139  K 3.6 3.4* 4.0  CL 93* 98* 98*  CO2 BUN 75* 37* 48*  CREATININE 12.07* 7.14* 8.92*  CALCIUM 8.1* 8.3* 8.4*  PHOS 6.1*  --  4.4  GLUCOSE 86 92 81   Protein Profile:  Recent Labs Lab 06/21/15 1619 06/23/15 1718  ALBUMIN 2.7* 2.7*     Weight  Trend since Admission: Filed Weights   06/27/15 0203 06/27/15 0500 06/27/15 1300  Weight: 145 lb 3.2 oz (65.862 kg) 145 lb 3.2 oz (65.862 kg) 147 lb 0.8 oz (66.7 kg)     Skin:  Reviewed, no issues  BMI:  Body mass index is 25.23 kg/(m^2).  Estimated Nutritional Needs:   Kcal:  1480-1615 kcals (BEE 1035, 1.3 AF, 1.1-1.2 IF)   Protein:  77-90 g (1.2-1.4 g/kg)   Fluid:  1000 mL plus UOP   HIGH Care Level  Leda Quail, RD, LDN Pager [phone]

## 2015-06-28 LAB — GLUCOSE, CAPILLARY
GLUCOSE-CAPILLARY: 145 mg/dL — AB (ref 65–99)
Glucose-Capillary: 149 mg/dL — ABNORMAL HIGH (ref 65–99)

## 2015-06-28 MED ORDER — RISPERIDONE 1 MG PO TABS: 1.0000 mg | ORAL_TABLET | Freq: Every evening | ORAL | Status: DC | PRN

## 2015-06-28 MED ORDER — DIAZEPAM 2 MG PO TABS: 5.0000 mg | ORAL_TABLET | ORAL | Status: DC

## 2015-06-28 MED ORDER — RISPERIDONE 2 MG PO TABS: 2.0000 mg | ORAL_TABLET | ORAL | Status: DC

## 2015-06-28 NOTE — Progress Notes (Signed)
Patient is medically stable for D/C to [facility] today. Per Gavin Pound admissions coordinator at [facility] patient is going to room 217. RN will call report to [facility] and arrange EMS for transport. Clinical Child psychotherapist (CSW) prepared D/C packet and sent D/C Summary to [employer] via carefinder. CSW also faxed D/C Summary to Debbie at [facility] on [address]. Per Eunice Blase patient's next chair time is 11:20 am on Thursday 06/29/15. CSW contacted [facility] Batten dialysis liaison and made her aware of above. Per Dr. Cherylann Ratel patient will not require a sitter for dialysis. CSW left a voicemail for patient's guardian Verlon Au and made her aware of above. Please reconsult if future social work needs arise. CSW signing off.   Jetta Lout, LCSWA [phone]

## 2015-06-28 NOTE — Discharge Summary (Signed)
[facility] [place] at [facility]   PATIENT NAME: Caroline Flores    MR#:  [id number]  DATE OF BIRTH:  03-31-1920  DATE OF ADMISSION:  06/19/2015 ADMITTING PHYSICIAN: Crissie Figures, MD  DATE OF DISCHARGE: 06/28/2015  PRIMARY CARE PHYSICIAN: Angus Palms   ADMISSION DIAGNOSIS:  Hyperkalemia [E87.5] Elevated troponin [R79.89] Chest pain, unspecified chest pain type [R07.9]  DISCHARGE DIAGNOSIS:  Principal Problem:   Dementia Active Problems:   Chest pain   Elevated troponin   ESRD on hemodialysis   Hyperkalemia   HTN (hypertension)   DM (diabetes mellitus)   Diabetes mellitus, type II   Chronic kidney disease (CKD), stage V   SECONDARY DIAGNOSIS:   Past Medical History  Diagnosis Date  . HTN (hypertension)   . Diabetes mellitus, type II   . Hearing loss   . Memory loss   . Chronic kidney disease (CKD), stage V     GFR 2  . Dementia     HOSPITAL COURSE:   81 year old female with past medical history significant for end-stage renal disease on hemodialysis, dementia, hypertension and diabetes admitted for missing hemodialysis all week due to confusion and agitation.  #1 end-stage renal disease on hemodialysis-missed hemodialysis as outpatient-patient refused it. -Appreciate nephrology consult. -Started back on hemodialysis and last dialyzed on 06/27/2015. Outpatient dialysis spot confirmed for 06/29/2015. -Her main reason for refusing hemodialysis was her dementia causing agitation and they cannot take her for outpatient dialysis as she might disrupt other patients. Psychiatric has seen the patient in the hospital and a regimen of Valium and risperidone as needed prior to dialysis has been helping here. We'll discharge on the same.  #2 dementia with behavioral disturbance-has significant sundowning and agitation at nighttime. We'll change the risperidone to bedtime as needed. also on Valium and risperidone before dialysis on Tuesday  Thursday and Saturday.  outpatient psychiatric follow-up recommended.  #3 uremic encephalopathy-resolved after treating her UTI.  #4 Elevated troponin secondary to end-stage renal disease and poor renal clearance. Not significant.  #5 hypertension- stable and continue home medications  #6 diabetes mellitus-stable clinically not in any medications.  Patient will be discharged to [facility] today.  DISCHARGE CONDITIONS:   Guarded  CONSULTS OBTAINED:  Treatment Team:  Mosetta Pigeon, MD Suan Halter, MD Audery Amel, MD  DRUG ALLERGIES:  No Known Allergies  DISCHARGE MEDICATIONS:   Current Discharge Medication List    START taking these medications   Details  diazepam (VALIUM) 2 MG tablet Take 2.5 tablets (5 mg total) by mouth 3 (three) times a week. Please give on Tue, Thur, Sat prior to Dialysis Qty: 15 tablet, Refills: 0    hydrALAZINE (APRESOLINE) 25 MG tablet Take 1 tablet (25 mg total) by mouth every 8 (eight) hours. Qty: 90 tablet, Refills: 0    !! risperiDONE (RISPERDAL) 1 MG tablet Take 1 tablet (1 mg total) by mouth at bedtime as needed (agitation, confusion). Qty: 15 tablet, Refills: 0    !! risperiDONE (RISPERDAL) 2 MG tablet Take 1 tablet (2 mg total) by mouth Every Tuesday,Thursday,and Saturday with dialysis. Give as need prior to Dialysis every Tuesday, Thursday and Saturday as needed for agitation Qty: 15 tablet, Refills: 0     !! - Potential duplicate medications found. Please discuss with provider.    CONTINUE these medications which have CHANGED   Details  lisinopril (PRINIVIL,ZESTRIL) 40 MG tablet Take 1 tablet (40 mg total) by mouth daily. Qty: 30 tablet, Refills: 0  metoprolol (LOPRESSOR) 50 MG tablet Take 1 tablet (50 mg total) by mouth 2 (two) times daily. Qty: 60 tablet, Refills: 0      CONTINUE these medications which have NOT CHANGED   Details  acetaminophen (TYLENOL) 500 MG tablet Take 1,000 mg by mouth 2 (two) times  daily.    amLODipine (NORVASC) 10 MG tablet Take 10 mg by mouth daily.    clopidogrel (PLAVIX) 75 MG tablet Take 75 mg by mouth daily.    Cyanocobalamin (VITAMIN B12) 1000 MCG TBCR Take 1 tablet by mouth daily.    donepezil (ARICEPT) 5 MG tablet Take 5 mg by mouth at bedtime.    isosorbide mononitrate (IMDUR) 30 MG 24 hr tablet Take 120 mg by mouth daily.     !! LORazepam (ATIVAN) 2 MG/ML injection Inject 1 mg into the vein once a week. Every Saturday for dialysis    !! LORazepam (ATIVAN) 2 MG/ML injection Inject 0.5 mg into the vein once a week. Prior to dialysis on Tues and Thurs    polyethylene glycol (MIRALAX / GLYCOLAX) packet Take 17 g by mouth daily.    sertraline (ZOLOFT) 50 MG tablet Take 50 mg by mouth daily.    sevelamer carbonate (RENVELA) 800 MG tablet Take 2,400 mg by mouth 3 (three) times daily with meals.    aspirin 81 MG tablet Take 81 mg by mouth daily.     !! - Potential duplicate medications found. Please discuss with provider.       DISCHARGE INSTRUCTIONS:   #1. Follow-up with PCP in one week. #2. For dialysis on 06/29/2015 as scheduled and follow-up with nephrology. #3. Please give Valium and risperidone as needed prior to each dialysis session.  If you experience worsening of your admission symptoms, develop shortness of breath, life threatening emergency, suicidal or homicidal thoughts you must seek medical attention immediately by calling 911 or calling your MD immediately  if symptoms less severe.  You Must read complete instructions/literature along with all the possible adverse reactions/side effects for all the Medicines you take and that have been prescribed to you. Take any new Medicines after you have completely understood and accept all the possible adverse reactions/side effects.   Please note  You were cared for by a hospitalist during your hospital stay. If you have any questions about your discharge medications or the care you received  while you were in the hospital after you are discharged, you can call the unit and asked to speak with the hospitalist on call if the hospitalist that took care of you is not available. Once you are discharged, your primary care physician will handle any further medical issues. Please note that NO REFILLS for any discharge medications will be authorized once you are discharged, as it is imperative that you return to your primary care physician (or establish a relationship with a primary care physician if you do not have one) for your aftercare needs so that they can reassess your need for medications and monitor your lab values.    Today   CHIEF COMPLAINT:   Chief Complaint  Patient presents with  . Generalized Body Aches    VITAL SIGNS:  Blood pressure 127/46, pulse 76, temperature 99.4 F (37.4 C), temperature source Oral, resp. rate 18, height 5\' 4"  (1.626 m), weight 64.638 kg (142 lb 8 oz), SpO2 97 %.  I/O:   Intake/Output Summary (Last 24 hours) at 06/28/15 1338 Last data filed at 06/28/15 1200  Gross per 24 hour  Intake  240 ml  Output   2002 ml  Net  -1762 ml    PHYSICAL EXAMINATION:   Physical Exam  GENERAL:  81 y.o.-year-old patient lying in the bed with no acute distress. Difficult to be aroused on exam. EYES: Pupils equal, round, reactive to light and accommodation. No scleral icterus. Extraocular muscles intact.  HEENT: Head atraumatic, normocephalic. Oropharynx and nasopharynx clear.  NECK:  Supple, no jugular venous distention. No thyroid enlargement, no tenderness.  LUNGS: Normal breath sounds bilaterally, no wheezing, rales,rhonchi or crepitation. No use of accessory muscles of respiration. Decreased breath sounds bilaterally CARDIOVASCULAR: S1, S2 normal. No murmurs, rubs, or gallops.  ABDOMEN: Soft, non-tender, non-distended. Bowel sounds present. No organomegaly or mass.  EXTREMITIES: No pedal edema, cyanosis, or clubbing.  NEUROLOGIC: No changes in motor  or sensory exams. Gait not checked. Patient not cooperative for complete neuro exam. She is very confused and not cooperative  PSYCHIATRIC: The patient is confused and agitated when aroused. Seems to be her baseline. SKIN: No obvious rash, lesion, or ulcer.   DATA REVIEW:   CBC  Recent Labs Lab 06/27/15 1407  WBC 6.0  HGB 8.6*  HCT 27.0*  PLT 202    Chemistries   Recent Labs Lab 06/27/15 1406  NA 139  K 4.7  CL 98*  CO2 32  GLUCOSE 228*  BUN 39*  CREATININE 6.90*  CALCIUM 8.4*    Cardiac Enzymes No results for input(s): TROPONINI in the last 168 hours.  Microbiology Results  Results for orders placed or performed during the hospital encounter of 06/19/15  MRSA PCR Screening     Status: None   Collection Time: 06/20/15 11:30 AM  Result Value Ref Range Status   MRSA by PCR NEGATIVE NEGATIVE Final    Comment:        The GeneXpert MRSA Assay (FDA approved for NASAL specimens only), is one component of a comprehensive MRSA colonization surveillance program. It is not intended to diagnose MRSA infection nor to guide or monitor treatment for MRSA infections.     RADIOLOGY:  No results found.  EKG:   Orders placed or performed during the hospital encounter of 06/19/15  . ED EKG  . ED EKG  . EKG 12-Lead  . EKG 12-Lead      Management plans discussed with the patient, family and they are in agreement.  CODE STATUS:     Code Status Orders        Start     Ordered   06/20/15 0523  Full code   Continuous     06/20/15 0522      TOTAL TIME TAKING CARE OF THIS PATIENT: 40 minutes.    Enid Baas M.D on 06/28/2015 at 1:38 PM  Between 7am to 6pm - Pager - [phone]  After 6pm go to [email or web address] - password [email or web address]  [facility]  Office  [phone]  CC: Primary care physician; Pcp Not In System

## 2015-06-28 NOTE — Care Management Note (Signed)
Outpatient dialysis placement has been confirmed and is scheduled to return to [facility] on Thursday [facility]1:30 pending discharge today and medication orders completed to [facility] to help patient remain calm during her time at the clinic. Ivor Reining  Dialysis Liaison  [phone]

## 2015-06-28 NOTE — Care Management Important Message (Signed)
Important Message  Patient Details  Name: Caroline Flores MRN: [id number] Date of Birth: [birth year]   Medicare Important Message Given:  Yes-second notification given    Olegario Messier A Allmond 06/28/2015, 9:55 AM

## 2015-09-19 ENCOUNTER — Emergency Department: Payer: Medicare Other

## 2015-09-19 ENCOUNTER — Encounter: Payer: Self-pay | Admitting: *Deleted

## 2015-09-19 ENCOUNTER — Emergency Department
Admission: EM | Admit: 2015-09-19 | Discharge: 2015-09-19 | Disposition: A | Discharge status: Home / Self Care | Payer: Medicare Other | Attending: Emergency Medicine | Admitting: Emergency Medicine

## 2015-09-19 DIAGNOSIS — E119 Type 2 diabetes mellitus without complications: Secondary | ICD-10-CM | POA: Diagnosis not present

## 2015-09-19 DIAGNOSIS — I120 Hypertensive chronic kidney disease with stage 5 chronic kidney disease or end stage renal disease: Secondary | ICD-10-CM | POA: Diagnosis not present

## 2015-09-19 DIAGNOSIS — Z992 Dependence on renal dialysis: Secondary | ICD-10-CM | POA: Diagnosis not present

## 2015-09-19 DIAGNOSIS — Z720 Tobacco use: Secondary | ICD-10-CM | POA: Insufficient documentation

## 2015-09-19 DIAGNOSIS — Z7902 Long term (current) use of antithrombotics/antiplatelets: Secondary | ICD-10-CM | POA: Insufficient documentation

## 2015-09-19 DIAGNOSIS — R4182 Altered mental status, unspecified: Secondary | ICD-10-CM

## 2015-09-19 DIAGNOSIS — Z79899 Other long term (current) drug therapy: Secondary | ICD-10-CM | POA: Diagnosis not present

## 2015-09-19 DIAGNOSIS — N186 End stage renal disease: Secondary | ICD-10-CM | POA: Diagnosis not present

## 2015-09-19 LAB — URINALYSIS COMPLETE WITH MICROSCOPIC (ARMC ONLY)
Bacteria, UA: NONE SEEN
Bilirubin Urine: UNDETERMINED — AB
Glucose, UA: UNDETERMINED mg/dL — AB
Hgb urine dipstick: UNDETERMINED — AB
Ketones, ur: UNDETERMINED mg/dL — AB
Leukocytes, UA: UNDETERMINED — AB
Nitrite: UNDETERMINED — AB
Protein, ur: UNDETERMINED mg/dL — AB
Specific Gravity, Urine: UNDETERMINED (ref 1.005–1.030)
Squamous Epithelial / LPF: NONE SEEN
pH: UNDETERMINED (ref 5.0–8.0)

## 2015-09-19 LAB — CBC WITH DIFFERENTIAL/PLATELET
BASOS ABS: 0 10*3/uL (ref 0–0.1)
BASOS PCT: 0 %
Eosinophils Absolute: 0 10*3/uL (ref 0–0.7)
Eosinophils Relative: 1 %
HEMATOCRIT: 38.9 % (ref 35.0–47.0)
HEMOGLOBIN: 12.5 g/dL (ref 12.0–16.0)
LYMPHS PCT: 11 %
Lymphs Abs: 0.9 10*3/uL — ABNORMAL LOW (ref 1.0–3.6)
MCH: 28.5 pg (ref 26.0–34.0)
MCHC: 32.1 g/dL (ref 32.0–36.0)
MCV: 88.8 fL (ref 80.0–100.0)
MONO ABS: 0.8 10*3/uL (ref 0.2–0.9)
Monocytes Relative: 10 %
NEUTROS ABS: 6.6 10*3/uL — AB (ref 1.4–6.5)
NEUTROS PCT: 78 %
Platelets: 169 10*3/uL (ref 150–440)
RBC: 4.38 MIL/uL (ref 3.80–5.20)
RDW: 17.6 % — AB (ref 11.5–14.5)
WBC: 8.3 10*3/uL (ref 3.6–11.0)

## 2015-09-19 LAB — TROPONIN I: Troponin I: 0.07 ng/mL — ABNORMAL HIGH (ref <0.031)

## 2015-09-19 LAB — COMPREHENSIVE METABOLIC PANEL
ALT: 15 U/L (ref 14–54)
AST: 19 U/L (ref 15–41)
Albumin: 3.2 g/dL — ABNORMAL LOW (ref 3.5–5.0)
Alkaline Phosphatase: 76 U/L (ref 38–126)
Anion gap: 11 (ref 5–15)
BUN: 31 mg/dL — ABNORMAL HIGH (ref 6–20)
CO2: 27 mmol/L (ref 22–32)
Calcium: 8.5 mg/dL — ABNORMAL LOW (ref 8.9–10.3)
Chloride: 97 mmol/L — ABNORMAL LOW (ref 101–111)
Creatinine, Ser: 4.83 mg/dL — ABNORMAL HIGH (ref 0.44–1.00)
GFR calc Af Amer: 8 mL/min — ABNORMAL LOW (ref >60)
GFR calc non Af Amer: 7 mL/min — ABNORMAL LOW (ref >60)
Glucose, Bld: 158 mg/dL — ABNORMAL HIGH (ref 65–99)
Potassium: 3.7 mmol/L (ref 3.5–5.1)
Sodium: 135 mmol/L (ref 135–145)
Total Bilirubin: 0.7 mg/dL (ref 0.3–1.2)
Total Protein: 6.7 g/dL (ref 6.5–8.1)

## 2015-09-19 NOTE — ED Notes (Signed)
Tegaderm placed over dialysis catheter in left chest.

## 2015-09-19 NOTE — ED Notes (Signed)
Patient is off unit at this time.

## 2015-09-19 NOTE — ED Notes (Signed)
PAtient arrived by EMS from dialysis. During treatment patient started vomiting and having altered mental status. [facility]. Patient port is flushing and drawing back blood well but the port is no longer intact with skin.

## 2015-09-19 NOTE — ED Notes (Signed)
[facility] bas[facility], but was disconnected.

## 2015-09-19 NOTE — ED Notes (Signed)
Spoke with [facility] nursing supervisor Roddie McMarlene who states that patient's baseline mental status is alert to self and able to answer simple questions or state needs such as being hungry. Roddie McMarlene states patient likes to swear.

## 2015-09-19 NOTE — Discharge Instructions (Signed)
Please seek medical attention for any high fevers, chest pain, shortness of breath, change in behavior, persistent vomiting, bloody stool or any other new or concerning symptoms. ° ° °Confusion °Confusion is the inability to think with your usual speed or clarity. Confusion may come on quickly or slowly over time. How quickly the confusion comes on depends on the cause. Confusion can be due to any number of causes. °CAUSES  °· Concussion, head injury, or head trauma. °· Seizures. °· Stroke. °· Fever. °· Brain tumor. °· Age related decreased brain function (dementia). °· Heightened emotional states like rage or terror. °· Mental illness in which the person loses the ability to determine what is real and what is not (hallucinations). °· Infections such as a urinary tract infection (UTI). °· Toxic effects from alcohol, drugs, or prescription medicines. °· Dehydration and an imbalance of salts in the body (electrolytes). °· Lack of sleep. °· Low blood sugar (diabetes). °· Low levels of oxygen from conditions such as chronic lung disorders. °· Drug interactions or other medicine side effects. °· Nutritional deficiencies, especially niacin, thiamine, vitamin C, or vitamin B. °· Sudden drop in body temperature (hypothermia). °· Change in routine, such as when traveling or hospitalized. °SIGNS AND SYMPTOMS  °People often describe their thinking as cloudy or unclear when they are confused. Confusion can also include feeling disoriented. That means you are unaware of where or who you are. You may also not know what the date or time is. If confused, you may also have difficulty paying attention, remembering, and making decisions. Some people also act aggressively when they are confused.  °DIAGNOSIS  °The medical evaluation of confusion may include: °· Blood and urine tests. °· X-rays. °· Brain and nervous system tests. °· Analyzing your brain waves (electroencephalogram or EEG). °· Magnetic resonance imaging (MRI) of your  head. °· Computed tomography (CT) scan of your head. °· Mental status tests in which your health care provider may ask many questions. Some of these questions may seem silly or strange, but they are a very important test to help diagnose and treat confusion. °TREATMENT  °An admission to the hospital may not be needed, but a person with confusion should not be left alone. Stay with a family member or friend until the confusion clears. Avoid alcohol, pain relievers, or sedative drugs until you have fully recovered. Do not drive until directed by your health care provider. °HOME CARE INSTRUCTIONS  °What family and friends can do: °· To find out if someone is confused, ask the person to state his or her name, age, and the date. If the person is unsure or answers incorrectly, he or she is confused. °· Always introduce yourself, no matter how well the person knows you. °· Often remind the person of his or her location. °· Place a calendar and clock near the confused person. °· Help the person with his or her medicines. You may want to use a pill box, an alarm as a reminder, or give the person each dose as prescribed. °· Talk about current events and plans for the day. °· Try to keep the environment calm, quiet, and peaceful. °· Make sure the person keeps follow-up visits with his or her health care provider. °PREVENTION  °Ways to prevent confusion: °· Avoid alcohol. °· Eat a balanced diet. °· Get enough sleep. °· Take medicine only as directed by your health care provider. °· Do not become isolated. Spend time with other people and make plans for your days. °·   Keep careful watch on your blood sugar levels if you are diabetic. °SEEK IMMEDIATE MEDICAL CARE IF:  °· You develop severe headaches, repeated vomiting, seizures, blackouts, or slurred speech. °· There is increasing confusion, weakness, numbness, restlessness, or personality changes. °· You develop a loss of balance, have marked dizziness, feel uncoordinated, or  fall. °· You have delusions, hallucinations, or develop severe anxiety. °· Your family members think you need to be rechecked. °  °This information is not intended to replace advice given to you by your health care provider. Make sure you discuss any questions you have with your health care provider. °  °Document Released: 12/05/2004 Document Revised: 11/18/2014 Document Reviewed: 12/03/2013 °Elsevier Interactive Patient Education ©2016 [employer] ° °

## 2015-09-19 NOTE — ED Provider Notes (Signed)
[facility] Emergency Department Provider Note    ____________________________________________  Time seen: On EMS arrival  I have reviewed the triage vital signs and the nursing notes.   HISTORY  Chief Complaint Altered Mental Status   History limited by: Altered Mental Status   HPI Caroline Flores is a 81 y.o. female who presents to the emergency department today via EMS from dialysis because of concerns for altered mental status. The patient herself is unable to give history. Per EMS the patient had some decreased activity. They did state that the patient was given medication to help her anxiety during the dialysis..This included Valium and Risperdal. The patient herself has no complaints.     Past Medical History  Diagnosis Date  . HTN (hypertension)   . Diabetes mellitus, type II (HCC)   . Hearing loss   . Memory loss   . Chronic kidney disease (CKD), stage V (HCC)     GFR 2  . Dementia     Patient Active Problem List   Diagnosis Date Noted  . Chest pain 06/20/2015  . Elevated troponin 06/20/2015  . ESRD on hemodialysis (HCC) 06/20/2015  . Hyperkalemia 06/20/2015  . Dementia 06/20/2015  . HTN (hypertension) 06/20/2015  . DM (diabetes mellitus) (HCC) 06/20/2015  . Diabetes mellitus, type II (HCC)   . Chronic kidney disease (CKD), stage V [facility])     Past Surgical History  Procedure Laterality Date  . Back surgery      Current Outpatient Rx  Name  Route  Sig  Dispense  Refill  . acetaminophen (TYLENOL) 500 MG tablet   Oral   Take 1,000 mg by mouth 2 (two) times daily.         Marland Kitchen amLODipine (NORVASC) 10 MG tablet   Oral   Take 10 mg by mouth daily.         . clopidogrel (PLAVIX) 75 MG tablet   Oral   Take 75 mg by mouth daily.         . Cyanocobalamin (VITAMIN B12) 1000 MCG TBCR   Oral   Take 1,000 mcg by mouth daily.          . diazepam (VALIUM) 5 MG tablet   Oral   Take 5 mg by mouth 3 (three) times a week. Pt  uses on Tuesday, Thursday, and Saturday before dialysis.         Marland Kitchen donepezil (ARICEPT) 5 MG tablet   Oral   Take 5 mg by mouth at bedtime.         . hydrALAZINE (APRESOLINE) 25 MG tablet   Oral   Take 1 tablet (25 mg total) by mouth every 8 (eight) hours.   90 tablet   0   . isosorbide mononitrate (IMDUR) 120 MG 24 hr tablet   Oral   Take 120 mg by mouth daily.         Marland Kitchen lisinopril (PRINIVIL,ZESTRIL) 20 MG tablet   Oral   Take 20 mg by mouth daily.         . metoprolol (LOPRESSOR) 50 MG tablet   Oral   Take 1 tablet (50 mg total) by mouth 2 (two) times daily.   60 tablet   0   . Nutritional Supplements (FEEDING SUPPLEMENT, NEPRO CARB STEADY,) LIQD   Oral   Take 237 mLs by mouth daily.         . polyethylene glycol (MIRALAX / GLYCOLAX) packet   Oral   Take 17 g  by mouth daily.         . risperiDONE (RISPERDAL) 2 MG tablet   Oral   Take 2 mg by mouth 3 (three) times a week. Pt uses every Tuesday, Thursday, and Saturday before dialysis.         Marland Kitchen. sertraline (ZOLOFT) 50 MG tablet   Oral   Take 50 mg by mouth daily.         . sevelamer carbonate (RENVELA) 800 MG tablet   Oral   Take 2,400 mg by mouth 3 (three) times daily with meals.         Marland Kitchen. lisinopril (PRINIVIL,ZESTRIL) 40 MG tablet   Oral   Take 1 tablet (40 mg total) by mouth daily. Patient not taking: Reported on 09/19/2015   30 tablet   0   . risperiDONE (RISPERDAL) 1 MG tablet   Oral   Take 1 tablet (1 mg total) by mouth at bedtime as needed (agitation, confusion). Patient not taking: Reported on 09/19/2015   15 tablet   0     Allergies Shellfish allergy  No family history on file.  Social History Social History  Substance Use Topics  . Smoking status: Current Every Day Smoker  . Smokeless tobacco: Never Used  . Alcohol Use: No    Review of Systems Unable to obtain secondary to altered mental status ____________________________________________   PHYSICAL EXAM:  VITAL  SIGNS: ED Triage Vitals  Enc Vitals Group     BP 09/19/15 1552 113/52 mmHg     Pulse Rate 09/19/15 1552 86     Resp 09/19/15 1552 21     Temp 09/19/15 1552 97.9 F (36.6 C)     Temp Source 09/19/15 1552 Oral     SpO2 09/19/15 1552 98 %     Weight 09/19/15 1552 132 lb 4.4 oz (60 kg)     Height 09/19/15 1552 5\' 4"  (1.626 m)   Constitutional: Awake and alert. Eyes: Conjunctivae are normal. PERRL. Normal extraocular movements. ENT   Head: Normocephalic and atraumatic.   Nose: No congestion/rhinnorhea.   Mouth/Throat: Mucous membranes are moist.   Neck: No stridor. Hematological/Lymphatic/Immunilogical: No cervical lymphadenopathy. Cardiovascular: Normal rate, regular rhythm.  No murmurs, rubs, or gallops. Respiratory: Normal respiratory effort without tachypnea nor retractions. Breath sounds are clear and equal bilaterally. No wheezes/rales/rhonchi. Gastrointestinal: Soft and nontender. No distention.  Genitourinary: Deferred Musculoskeletal: Normal range of motion in all extremities. No joint effusions.  No lower extremity tenderness nor edema. Neurologic:  Normal speech and language. No gross focal neurologic deficits are appreciated.  Skin:  Skin is warm, dry and intact. Hemodialysis catheter in place in the left upper chest. Sutures have fallen out. Psychiatric: Mood and affect are normal. Speech and behavior are normal. Patient exhibits appropriate insight and judgment.  ____________________________________________    LABS (pertinent positives/negatives)  Labs Reviewed  CBC WITH DIFFERENTIAL/PLATELET - Abnormal; Notable for the following:    RDW 17.6 (*)    Neutro Abs 6.6 (*)    Lymphs Abs 0.9 (*)    All other components within normal limits  COMPREHENSIVE METABOLIC PANEL - Abnormal; Notable for the following:    Chloride 97 (*)    Glucose, Bld 158 (*)    BUN 31 (*)    Creatinine, Ser 4.83 (*)    Calcium 8.5 (*)    Albumin 3.2 (*)    GFR calc non Af Amer 7  (*)    GFR calc Af Amer 8 (*)    All other  components within normal limits  URINALYSIS COMPLETEWITH MICROSCOPIC ([facility] ONLY) - Abnormal; Notable for the following:    Color, Urine AMBER (*)    APPearance CLOUDY (*)    Glucose, UA QUANTITY NOT SUFFICIENT, UNABLE TO PERFORM TEST (*)    Bilirubin Urine QUANTITY NOT SUFFICIENT, UNABLE TO PERFORM TEST (*)    Ketones, ur QUANTITY NOT SUFFICIENT, UNABLE TO PERFORM TEST (*)    Hgb urine dipstick QUANTITY NOT SUFFICIENT, UNABLE TO PERFORM TEST (*)    Protein, ur QUANTITY NOT SUFFICIENT, UNABLE TO PERFORM TEST (*)    Nitrite QUANTITY NOT SUFFICIENT, UNABLE TO PERFORM TEST (*)    Leukocytes, UA QUANTITY NOT SUFFICIENT, UNABLE TO PERFORM TEST (*)    All other components within normal limits  TROPONIN I - Abnormal; Notable for the following:    Troponin I 0.07 (*)    All other components within normal limits     ____________________________________________   EKG  I, Phineas Semen, attending physician, personally viewed and interpreted this EKG  EKG Time: 1546 Rate: 86 Rhythm: NSR Axis: left axis deviation Intervals: qtc 464 QRS: narrow ST changes: no st elevation Impression: abnormal ekg ____________________________________________    RADIOLOGY  CT head  IMPRESSION: 1. Atrophy and chronic ischemic changes as detailed above. 2. No evidence of acute intracranial abnormality. No intracranial mass, hemorrhage, or edema.   ____________________________________________   PROCEDURES  Procedure(s) performed: None  Critical Care performed: No  ____________________________________________   INITIAL IMPRESSION / ASSESSMENT AND PLAN / ED COURSE  Pertinent labs & imaging results that were available during my care of the patient were reviewed by me and considered in my medical decision making (see chart for details).  Patient presented to the emergency department today from dialysis because of concerns for altered mental  status. Workup here without any remarkable findings. The troponin was elevated however patient has an elevated troponin in the past and has been a viable by cardiology. A think likely secondary to dialysis and not coronary syndrome. Additionally she is not a good candidate for intervention. While the patient was observed here she appeared to return to her baseline. She became much more awake and alert. The patient will be discharged back to her living facility.  ____________________________________________   FINAL CLINICAL IMPRESSION(S) / ED DIAGNOSES  Final diagnoses:  Altered mental status, unspecified altered mental status type     Phineas Semen, MD 09/20/15 1517

## 2015-09-19 NOTE — ED Notes (Signed)
Patient has family at bedside. Patient is alert and oriented to self. Patient able to tolerate In and Out cath well. Patient is verbal, states she is hungry, states "doctor don't know shit."

## 2015-09-26 ENCOUNTER — Other Ambulatory Visit
Admission: RE | Admit: 2015-09-26 | Discharge: 2015-09-26 | Disposition: A | Discharge status: Home / Self Care | Payer: Medicare Other | Source: Ambulatory Visit | Attending: Nurse Practitioner | Admitting: Nurse Practitioner

## 2015-09-26 DIAGNOSIS — R112 Nausea with vomiting, unspecified: Secondary | ICD-10-CM | POA: Insufficient documentation

## 2015-09-26 DIAGNOSIS — R509 Fever, unspecified: Secondary | ICD-10-CM | POA: Diagnosis not present

## 2015-09-26 LAB — CBC WITH DIFFERENTIAL/PLATELET
BASOS PCT: 1 %
Basophils Absolute: 0 10*3/uL (ref 0–0.1)
EOS ABS: 0.1 10*3/uL (ref 0–0.7)
EOS PCT: 2 %
HCT: 41.4 % (ref 35.0–47.0)
HEMOGLOBIN: 13.1 g/dL (ref 12.0–16.0)
LYMPHS ABS: 1.4 10*3/uL (ref 1.0–3.6)
Lymphocytes Relative: 17 %
MCH: 28.1 pg (ref 26.0–34.0)
MCHC: 31.7 g/dL — AB (ref 32.0–36.0)
MCV: 88.6 fL (ref 80.0–100.0)
MONOS PCT: 10 %
Monocytes Absolute: 0.8 10*3/uL (ref 0.2–0.9)
NEUTROS PCT: 72 %
Neutro Abs: 6.1 10*3/uL (ref 1.4–6.5)
PLATELETS: 220 10*3/uL (ref 150–440)
RBC: 4.67 MIL/uL (ref 3.80–5.20)
RDW: 17.7 % — ABNORMAL HIGH (ref 11.5–14.5)
WBC: 8.6 10*3/uL (ref 3.6–11.0)

## 2015-11-17 ENCOUNTER — Inpatient Hospital Stay: Payer: Medicare Other

## 2015-11-17 ENCOUNTER — Inpatient Hospital Stay
Admission: EM | Admit: 2015-11-17 | Discharge: 2015-11-21 | DRG: 299 | Disposition: A | Discharge status: Hospice | Payer: Medicare Other | Attending: Internal Medicine | Admitting: Internal Medicine

## 2015-11-17 ENCOUNTER — Emergency Department: Payer: Medicare Other

## 2015-11-17 DIAGNOSIS — D631 Anemia in chronic kidney disease: Secondary | ICD-10-CM | POA: Diagnosis present

## 2015-11-17 DIAGNOSIS — E1122 Type 2 diabetes mellitus with diabetic chronic kidney disease: Secondary | ICD-10-CM | POA: Diagnosis present

## 2015-11-17 DIAGNOSIS — Z87891 Personal history of nicotine dependence: Secondary | ICD-10-CM | POA: Diagnosis not present

## 2015-11-17 DIAGNOSIS — F329 Major depressive disorder, single episode, unspecified: Secondary | ICD-10-CM | POA: Diagnosis present

## 2015-11-17 DIAGNOSIS — F0390 Unspecified dementia without behavioral disturbance: Secondary | ICD-10-CM | POA: Diagnosis present

## 2015-11-17 DIAGNOSIS — M869 Osteomyelitis, unspecified: Secondary | ICD-10-CM | POA: Diagnosis present

## 2015-11-17 DIAGNOSIS — H9190 Unspecified hearing loss, unspecified ear: Secondary | ICD-10-CM | POA: Diagnosis present

## 2015-11-17 DIAGNOSIS — L97409 Non-pressure chronic ulcer of unspecified heel and midfoot with unspecified severity: Secondary | ICD-10-CM

## 2015-11-17 DIAGNOSIS — K219 Gastro-esophageal reflux disease without esophagitis: Secondary | ICD-10-CM | POA: Diagnosis present

## 2015-11-17 DIAGNOSIS — R627 Adult failure to thrive: Secondary | ICD-10-CM | POA: Diagnosis present

## 2015-11-17 DIAGNOSIS — R400 Somnolence: Secondary | ICD-10-CM | POA: Diagnosis not present

## 2015-11-17 DIAGNOSIS — E1169 Type 2 diabetes mellitus with other specified complication: Secondary | ICD-10-CM | POA: Diagnosis present

## 2015-11-17 DIAGNOSIS — Z515 Encounter for palliative care: Secondary | ICD-10-CM | POA: Diagnosis present

## 2015-11-17 DIAGNOSIS — I120 Hypertensive chronic kidney disease with stage 5 chronic kidney disease or end stage renal disease: Secondary | ICD-10-CM | POA: Diagnosis present

## 2015-11-17 DIAGNOSIS — Z7902 Long term (current) use of antithrombotics/antiplatelets: Secondary | ICD-10-CM | POA: Diagnosis not present

## 2015-11-17 DIAGNOSIS — E1152 Type 2 diabetes mellitus with diabetic peripheral angiopathy with gangrene: Principal | ICD-10-CM | POA: Diagnosis present

## 2015-11-17 DIAGNOSIS — I739 Peripheral vascular disease, unspecified: Secondary | ICD-10-CM | POA: Diagnosis present

## 2015-11-17 DIAGNOSIS — L89152 Pressure ulcer of sacral region, stage 2: Secondary | ICD-10-CM | POA: Diagnosis present

## 2015-11-17 DIAGNOSIS — E11621 Type 2 diabetes mellitus with foot ulcer: Secondary | ICD-10-CM | POA: Diagnosis present

## 2015-11-17 DIAGNOSIS — F419 Anxiety disorder, unspecified: Secondary | ICD-10-CM | POA: Diagnosis present

## 2015-11-17 DIAGNOSIS — N186 End stage renal disease: Secondary | ICD-10-CM | POA: Diagnosis present

## 2015-11-17 DIAGNOSIS — L97519 Non-pressure chronic ulcer of other part of right foot with unspecified severity: Secondary | ICD-10-CM | POA: Diagnosis present

## 2015-11-17 DIAGNOSIS — N2581 Secondary hyperparathyroidism of renal origin: Secondary | ICD-10-CM | POA: Diagnosis present

## 2015-11-17 DIAGNOSIS — G9340 Encephalopathy, unspecified: Secondary | ICD-10-CM | POA: Diagnosis present

## 2015-11-17 DIAGNOSIS — L97419 Non-pressure chronic ulcer of right heel and midfoot with unspecified severity: Secondary | ICD-10-CM | POA: Diagnosis present

## 2015-11-17 DIAGNOSIS — Z7982 Long term (current) use of aspirin: Secondary | ICD-10-CM | POA: Diagnosis not present

## 2015-11-17 DIAGNOSIS — Z66 Do not resuscitate: Secondary | ICD-10-CM | POA: Diagnosis present

## 2015-11-17 DIAGNOSIS — Z992 Dependence on renal dialysis: Secondary | ICD-10-CM

## 2015-11-17 DIAGNOSIS — I96 Gangrene, not elsewhere classified: Secondary | ICD-10-CM | POA: Diagnosis not present

## 2015-11-17 DIAGNOSIS — L97509 Non-pressure chronic ulcer of other part of unspecified foot with unspecified severity: Secondary | ICD-10-CM

## 2015-11-17 HISTORY — DX: Peripheral vascular disease, unspecified: I73.9

## 2015-11-17 LAB — GLUCOSE, CAPILLARY: Glucose-Capillary: 93 mg/dL (ref 65–99)

## 2015-11-17 LAB — CBC WITH DIFFERENTIAL/PLATELET
BASOS ABS: 0 10*3/uL (ref 0–0.1)
Basophils Relative: 1 %
EOS ABS: 0.1 10*3/uL (ref 0–0.7)
Eosinophils Relative: 1 %
HCT: 35.9 % (ref 35.0–47.0)
HEMOGLOBIN: 11.4 g/dL — AB (ref 12.0–16.0)
Lymphocytes Relative: 12 %
Lymphs Abs: 1 10*3/uL (ref 1.0–3.6)
MCH: 28.3 pg (ref 26.0–34.0)
MCHC: 31.7 g/dL — ABNORMAL LOW (ref 32.0–36.0)
MCV: 89.1 fL (ref 80.0–100.0)
MONO ABS: 0.7 10*3/uL (ref 0.2–0.9)
MONOS PCT: 9 %
NEUTROS PCT: 79 %
Neutro Abs: 6.6 10*3/uL — ABNORMAL HIGH (ref 1.4–6.5)
Platelets: 175 10*3/uL (ref 150–440)
RBC: 4.03 MIL/uL (ref 3.80–5.20)
RDW: 16.1 % — ABNORMAL HIGH (ref 11.5–14.5)
WBC: 8.4 10*3/uL (ref 3.6–11.0)

## 2015-11-17 LAB — COMPREHENSIVE METABOLIC PANEL
ALK PHOS: 77 U/L (ref 38–126)
ALT: 14 U/L (ref 14–54)
AST: 18 U/L (ref 15–41)
Albumin: 2.3 g/dL — ABNORMAL LOW (ref 3.5–5.0)
Anion gap: 6 (ref 5–15)
BUN: 15 mg/dL (ref 6–20)
CALCIUM: 8.9 mg/dL (ref 8.9–10.3)
CHLORIDE: 101 mmol/L (ref 101–111)
CO2: 31 mmol/L (ref 22–32)
CREATININE: 2.65 mg/dL — AB (ref 0.44–1.00)
GFR calc non Af Amer: 14 mL/min — ABNORMAL LOW (ref >60)
GFR, EST AFRICAN AMERICAN: 17 mL/min — AB (ref >60)
Glucose, Bld: 113 mg/dL — ABNORMAL HIGH (ref 65–99)
Potassium: 3.2 mmol/L — ABNORMAL LOW (ref 3.5–5.1)
SODIUM: 138 mmol/L (ref 135–145)
Total Bilirubin: 0.8 mg/dL (ref 0.3–1.2)
Total Protein: 6.3 g/dL — ABNORMAL LOW (ref 6.5–8.1)

## 2015-11-17 LAB — BASIC METABOLIC PANEL
ANION GAP: 9 (ref 5–15)
BUN: 16 mg/dL (ref 6–20)
CHLORIDE: 100 mmol/L — AB (ref 101–111)
CO2: 31 mmol/L (ref 22–32)
CREATININE: 2.72 mg/dL — AB (ref 0.44–1.00)
Calcium: 9 mg/dL (ref 8.9–10.3)
GFR calc non Af Amer: 14 mL/min — ABNORMAL LOW (ref >60)
GFR, EST AFRICAN AMERICAN: 16 mL/min — AB (ref >60)
Glucose, Bld: 117 mg/dL — ABNORMAL HIGH (ref 65–99)
Potassium: 3.4 mmol/L — ABNORMAL LOW (ref 3.5–5.1)
Sodium: 140 mmol/L (ref 135–145)

## 2015-11-17 LAB — HEPARIN LEVEL (UNFRACTIONATED)
Heparin Unfractionated: >3.6 IU/mL — ABNORMAL HIGH (ref 0.30–0.70)
Heparin Unfractionated: 0.14 IU/mL — ABNORMAL LOW (ref 0.30–0.70)

## 2015-11-17 LAB — CBC
HCT: 35.7 % (ref 35.0–47.0)
HEMOGLOBIN: 11.3 g/dL — AB (ref 12.0–16.0)
MCH: 27.7 pg (ref 26.0–34.0)
MCHC: 31.6 g/dL — ABNORMAL LOW (ref 32.0–36.0)
MCV: 87.8 fL (ref 80.0–100.0)
Platelets: 170 10*3/uL (ref 150–440)
RBC: 4.06 MIL/uL (ref 3.80–5.20)
RDW: 16.3 % — ABNORMAL HIGH (ref 11.5–14.5)
WBC: 8.2 10*3/uL (ref 3.6–11.0)

## 2015-11-17 LAB — PROTIME-INR
INR: 1.11
Prothrombin Time: 14.5 seconds (ref 11.4–15.0)

## 2015-11-17 LAB — APTT: aPTT: 39 seconds — ABNORMAL HIGH (ref 24–36)

## 2015-11-17 MED ORDER — HEPARIN BOLUS VIA INFUSION
1800.0000 [IU] | Freq: Once | INTRAVENOUS | Status: DC
  Filled 2015-11-17: qty 1800

## 2015-11-17 MED ORDER — PIPERACILLIN-TAZOBACTAM 3.375 G IVPB
3.3750 g | Freq: Two times a day (BID) | INTRAVENOUS | Status: DC
  Administered 2015-11-17 – 2015-11-20 (×7): 3.375 g via INTRAVENOUS
  Filled 2015-11-17 (×9): qty 50

## 2015-11-17 MED ORDER — DIAZEPAM 5 MG PO TABS: 5.0000 mg | ORAL_TABLET | ORAL | Status: DC

## 2015-11-17 MED ORDER — HEPARIN (PORCINE) IN NACL 100-0.45 UNIT/ML-% IJ SOLN
700.0000 [IU]/h | INTRAMUSCULAR | Status: DC
  Administered 2015-11-17 (×2): 700 [IU]/h via INTRAVENOUS
  Filled 2015-11-17: qty 250

## 2015-11-17 MED ORDER — VANCOMYCIN HCL 500 MG IV SOLR
500.0000 mg | Freq: Once | INTRAVENOUS | Status: AC
  Administered 2015-11-17: 500 mg via INTRAVENOUS
  Filled 2015-11-17: qty 500

## 2015-11-17 MED ORDER — SEVELAMER CARBONATE 800 MG PO TABS
2400.0000 mg | ORAL_TABLET | Freq: Three times a day (TID) | ORAL | Status: DC
  Administered 2015-11-17 (×2): 2400 mg via ORAL
  Filled 2015-11-17 (×3): qty 3

## 2015-11-17 MED ORDER — SODIUM CHLORIDE 0.9 % IJ SOLN
3.0000 mL | Freq: Two times a day (BID) | INTRAMUSCULAR | Status: DC
  Administered 2015-11-17 – 2015-11-20 (×6): 3 mL via INTRAVENOUS

## 2015-11-17 MED ORDER — LISINOPRIL 20 MG PO TABS
20.0000 mg | ORAL_TABLET | Freq: Every day | ORAL | Status: DC
  Administered 2015-11-17 – 2015-11-20 (×4): 20 mg via ORAL
  Filled 2015-11-17 (×4): qty 1

## 2015-11-17 MED ORDER — SODIUM CHLORIDE 0.9 % IV SOLN: 250.0000 mL | INTRAVENOUS | Status: DC | PRN

## 2015-11-17 MED ORDER — POTASSIUM CHLORIDE CRYS ER 20 MEQ PO TBCR
20.0000 meq | EXTENDED_RELEASE_TABLET | Freq: Once | ORAL | Status: AC
Start: 2015-11-17 — End: 2015-11-17
  Administered 2015-11-17: 20 meq via ORAL
  Filled 2015-11-17: qty 1

## 2015-11-17 MED ORDER — RISPERIDONE 0.5 MG PO TABS: 2.0000 mg | ORAL_TABLET | ORAL | Status: DC

## 2015-11-17 MED ORDER — CLOPIDOGREL BISULFATE 75 MG PO TABS
75.0000 mg | ORAL_TABLET | Freq: Every day | ORAL | Status: DC
  Administered 2015-11-17 – 2015-11-19 (×3): 75 mg via ORAL
  Filled 2015-11-17 (×3): qty 1

## 2015-11-17 MED ORDER — RISPERIDONE 0.5 MG PO TABS
2.0000 mg | ORAL_TABLET | ORAL | Status: DC
  Administered 2015-11-18: 2 mg via ORAL
  Filled 2015-11-17: qty 4

## 2015-11-17 MED ORDER — ACETAMINOPHEN 325 MG PO TABS: 650.0000 mg | ORAL_TABLET | Freq: Four times a day (QID) | ORAL | Status: DC | PRN

## 2015-11-17 MED ORDER — METOPROLOL TARTRATE 50 MG PO TABS
50.0000 mg | ORAL_TABLET | Freq: Two times a day (BID) | ORAL | Status: DC
  Administered 2015-11-19 – 2015-11-20 (×3): 50 mg via ORAL
  Filled 2015-11-17 (×6): qty 1

## 2015-11-17 MED ORDER — AMLODIPINE BESYLATE 10 MG PO TABS
10.0000 mg | ORAL_TABLET | Freq: Every day | ORAL | Status: DC
  Administered 2015-11-17 – 2015-11-20 (×4): 10 mg via ORAL
  Filled 2015-11-17 (×4): qty 1

## 2015-11-17 MED ORDER — HYDRALAZINE HCL 25 MG PO TABS
25.0000 mg | ORAL_TABLET | Freq: Three times a day (TID) | ORAL | Status: DC
  Administered 2015-11-17 – 2015-11-20 (×6): 25 mg via ORAL
  Filled 2015-11-17 (×9): qty 1

## 2015-11-17 MED ORDER — HEPARIN BOLUS VIA INFUSION
1800.0000 [IU] | Freq: Once | INTRAVENOUS | Status: AC
  Administered 2015-11-17: 1800 [IU] via INTRAVENOUS
  Filled 2015-11-17: qty 1800

## 2015-11-17 MED ORDER — ASPIRIN EC 81 MG PO TBEC
81.0000 mg | DELAYED_RELEASE_TABLET | Freq: Every day | ORAL | Status: DC
  Administered 2015-11-17 – 2015-11-19 (×3): 81 mg via ORAL
  Filled 2015-11-17 (×3): qty 1

## 2015-11-17 MED ORDER — POLYETHYLENE GLYCOL 3350 17 G PO PACK: 17.0000 g | PACK | Freq: Every day | ORAL | Status: DC

## 2015-11-17 MED ORDER — NEPRO/CARBSTEADY PO LIQD
237.0000 mL | Freq: Every day | ORAL | Status: DC
  Administered 2015-11-17 – 2015-11-19 (×2): 237 mL via ORAL

## 2015-11-17 MED ORDER — HEPARIN (PORCINE) IN NACL 100-0.45 UNIT/ML-% IJ SOLN: 16.0000 [IU]/kg/h | Freq: Once | INTRAMUSCULAR | Status: DC

## 2015-11-17 MED ORDER — HEPARIN BOLUS VIA INFUSION
2000.0000 [IU] | Freq: Once | INTRAVENOUS | Status: AC
  Administered 2015-11-17: 2000 [IU] via INTRAVENOUS
  Filled 2015-11-17: qty 2000

## 2015-11-17 MED ORDER — SENNOSIDES-DOCUSATE SODIUM 8.6-50 MG PO TABS: 1.0000 | ORAL_TABLET | Freq: Every evening | ORAL | Status: DC | PRN

## 2015-11-17 MED ORDER — ONDANSETRON HCL 4 MG PO TABS: 4.0000 mg | ORAL_TABLET | Freq: Four times a day (QID) | ORAL | Status: DC | PRN

## 2015-11-17 MED ORDER — FAMOTIDINE 20 MG PO TABS
20.0000 mg | ORAL_TABLET | Freq: Every day | ORAL | Status: DC
  Administered 2015-11-18 – 2015-11-19 (×2): 20 mg via ORAL
  Filled 2015-11-17 (×3): qty 1

## 2015-11-17 MED ORDER — SERTRALINE HCL 50 MG PO TABS
50.0000 mg | ORAL_TABLET | Freq: Every day | ORAL | Status: DC
  Administered 2015-11-17 – 2015-11-20 (×4): 50 mg via ORAL
  Filled 2015-11-17 (×4): qty 1

## 2015-11-17 MED ORDER — VANCOMYCIN HCL 500 MG IV SOLR: 500.0000 mg | INTRAVENOUS | Status: DC

## 2015-11-17 MED ORDER — HEPARIN (PORCINE) IN NACL 100-0.45 UNIT/ML-% IJ SOLN
1000.0000 [IU]/h | INTRAMUSCULAR | Status: DC
Start: 2015-11-17 — End: 2015-11-20
  Administered 2015-11-17 – 2015-11-18 (×3): 850 [IU]/h via INTRAVENOUS
  Administered 2015-11-19: 1000 [IU]/h via INTRAVENOUS
  Filled 2015-11-17 (×7): qty 250

## 2015-11-17 MED ORDER — ISOSORBIDE MONONITRATE ER 60 MG PO TB24
120.0000 mg | ORAL_TABLET | Freq: Every day | ORAL | Status: DC
  Administered 2015-11-17 – 2015-11-20 (×4): 120 mg via ORAL
  Filled 2015-11-17 (×4): qty 2

## 2015-11-17 MED ORDER — ACETAMINOPHEN 650 MG RE SUPP: 650.0000 mg | Freq: Four times a day (QID) | RECTAL | Status: DC | PRN

## 2015-11-17 MED ORDER — SODIUM CHLORIDE 0.9 % IJ SOLN: 3.0000 mL | INTRAMUSCULAR | Status: DC | PRN

## 2015-11-17 MED ORDER — HEPARIN SODIUM (PORCINE) 5000 UNIT/ML IJ SOLN: 60.0000 [IU]/kg | Freq: Once | INTRAMUSCULAR | Status: DC

## 2015-11-17 MED ORDER — PIPERACILLIN-TAZOBACTAM 3.375 G IVPB
3.3750 g | Freq: Once | INTRAVENOUS | Status: AC
  Administered 2015-11-17: 3.375 g via INTRAVENOUS
  Filled 2015-11-17: qty 50

## 2015-11-17 MED ORDER — VANCOMYCIN HCL 10 G IV SOLR
1250.0000 mg | Freq: Once | INTRAVENOUS | Status: AC
  Administered 2015-11-17: 1250 mg via INTRAVENOUS
  Filled 2015-11-17: qty 1250

## 2015-11-17 MED ORDER — DIAZEPAM 5 MG PO TABS
5.0000 mg | ORAL_TABLET | ORAL | Status: DC
  Administered 2015-11-18: 5 mg via ORAL
  Filled 2015-11-17: qty 1

## 2015-11-17 MED ORDER — SODIUM CHLORIDE 0.9 % IJ SOLN
3.0000 mL | Freq: Two times a day (BID) | INTRAMUSCULAR | Status: DC
  Administered 2015-11-17 – 2015-11-20 (×5): 3 mL via INTRAVENOUS

## 2015-11-17 MED ORDER — ONDANSETRON HCL 4 MG/2ML IJ SOLN: 4.0000 mg | Freq: Four times a day (QID) | INTRAMUSCULAR | Status: DC | PRN

## 2015-11-17 MED ORDER — VITAMIN B-12 1000 MCG PO TABS
1000.0000 ug | ORAL_TABLET | Freq: Every day | ORAL | Status: DC
  Administered 2015-11-17 – 2015-11-20 (×4): 1000 ug via ORAL
  Filled 2015-11-17 (×4): qty 1

## 2015-11-17 MED ORDER — HYDROCODONE-ACETAMINOPHEN 5-325 MG PO TABS
1.0000 | ORAL_TABLET | ORAL | Status: DC | PRN
  Administered 2015-11-17: 1 via ORAL
  Filled 2015-11-17: qty 1

## 2015-11-17 NOTE — Progress Notes (Addendum)
Spoke to pharm regarding the difficulty I was having with scanning the bolus of heparin. They tried to fix the problem but they were still unable to get it to work. They told me to "just run" the dose. Charge nurse & myself ran the bolus dose of heparin.

## 2015-11-17 NOTE — Progress Notes (Signed)
ANTIBIOTIC CONSULT NOTE - INITIAL  Pharmacy Consult for vancomycin/Zosyn Indication: Osteomyelitis/gangrene  Allergies  Allergen Reactions  . Shellfish Allergy Other (See Comments)    Reaction:  Unknown     Patient Measurements: Height: 5\' 4"  (162.6 cm) IBW/kg (Calculated) : 54.7 Adjusted Body Weight: 60 kg  Vital Signs: Temp: 98.1 F (36.7 C) (01/06 0056) Temp Source: Oral (01/06 0056) BP: 151/51 mmHg (01/06 0228) Pulse Rate: 74 (01/06 0228) Intake/Output from previous day:   Intake/Output from this shift:    Labs:  Recent Labs  11/17/15 0101  WBC 8.4  HGB 11.4*  PLT 175  CREATININE 2.65*   CrCl cannot be calculated (Unknown ideal weight.). No results for input(s): VANCOTROUGH, VANCOPEAK, VANCORANDOM, GENTTROUGH, GENTPEAK, GENTRANDOM, TOBRATROUGH, TOBRAPEAK, TOBRARND, AMIKACINPEAK, AMIKACINTROU, AMIKACIN in the last 72 hours.   Microbiology: No results found for this or any previous visit (from the past 720 hour(s)).  Medical History: Past Medical History  Diagnosis Date  . HTN (hypertension)   . Diabetes mellitus, type II (HCC)   . Hearing loss   . Memory loss   . Chronic kidney disease (CKD), stage V (HCC)     GFR 2  . Dementia   . Peripheral vascular disease (HCC)     Medications:  Infusions:  . heparin 700 Units/hr (11/17/15 0251)  . piperacillin-tazobactam (ZOSYN)  IV 3.375 g (11/17/15 0234)  . vancomycin 1,250 mg (11/17/15 0244)   Assessment: 89 [facility]. With cold extremeties starting abx for osteo/gangrene.   Vd 54 L, hemodialysis patient  Goal of Therapy:  Vancomycin trough level 15-20 mcg/ml  Plan:  Expected duration 7 days with resolution of temperature and/or normalization of WBC. Zosyn 3.375 gm IV Q12H EI and vancomycin 1.25 gm IV x 1 followed by 500 mg IV after each dialysis, vancomycin level scheduled for 11/20/14 AM. Pharmacy will continue to follow.  Carola FrostNathan A Rayme Bui, Pharm.D., BCPS Clinical  Pharmacist 11/17/2015,3:28 AM

## 2015-11-17 NOTE — Progress Notes (Signed)
ANTICOAGULATION CONSULT NOTE - follow up  Pharmacy Consult for heparin Indication: PAD  Allergies  Allergen Reactions  . Shellfish Allergy Other (See Comments)    Reaction:  Unknown     Patient Measurements: Height: 5\' 4"  (162.6 cm) Weight: 134 lb 7.7 oz (61 kg) IBW/kg (Calculated) : 54.7 Heparin Dosing Weight: 60 kg  Vital Signs: Temp: 97.4 F (36.3 C) (01/06 0813) Temp Source: Oral (01/06 0813) BP: 182/63 mmHg (01/06 0813) Pulse Rate: 69 (01/06 0813)  Labs:  Recent Labs  11/17/15 0101 11/17/15 0534 11/17/15 1218  HGB 11.4* 11.3*  --   HCT 35.9 35.7  --   PLT 175 170  --   APTT 39*  --   --   LABPROT 14.5  --   --   INR 1.11  --   --   HEPARINUNFRC  --   --  0.14*  CREATININE 2.65* 2.72*  --     Estimated Creatinine Clearance: 11.2 mL/min (by C-G formula based on Cr of 2.72).   Medical History: Past Medical History  Diagnosis Date  . HTN (hypertension)   . Diabetes mellitus, type II (HCC)   . Hearing loss   . Memory loss   . Chronic kidney disease (CKD), stage V (HCC)     GFR 2  . Dementia   . Peripheral vascular disease (HCC)     Medications:  Infusions:  . heparin      Assessment: 89 yof cc full code status per [facility]. Some PAD with extremity discoloration per nursing, pharmacy consulted to dose heparin.   Goal of Therapy:  Heparin level 0.3-0.7 units/ml Monitor platelets by anticoagulation protocol: Yes   Plan:  Give 2000 units bolus x 1 Start heparin infusion at 700 units/hr Check anti-Xa level in 8 hours and daily while on heparin Continue to monitor H&H and platelets   1/6: Heparin level at 1218= 0.14 (Note: Lab had issues with HL from 1000 so Hemodialysis RN drew blood from pt. while in HD and sent to lab ~1200). Will give heparin bolus of 1800 units and increase drip to 850 units/hr. Will recheck in 8 hours at 2200  Bernese Doffing A, Pharm.D., BCPS Clinical Pharmacist 11/17/2015,1:09 PM

## 2015-11-17 NOTE — NC FL2 (Signed)
[place] MEDICAID FL2 LEVEL OF CARE SCREENING TOOL     IDENTIFICATION  Patient Name: Caroline Flores Birthdate: [birth year] Sex: female Admission Date (Current Location): 11/17/2015  [place]   Facility and Address:  [facility], [address], [place], [place] [address]      Provider Number: [id number]  Attending Physician Name and Address:  Ramonita LabAruna Gouru, MD  Relative Name and Phone Number:       Current Level of Care: Hospital Recommended Level of Care: Skilled Nursing Facility [facility]) Prior Approval Number:    Date Approved/Denied:   PASRR Number:  [id number])  Discharge Plan: SNF [facility] )    Current Diagnoses: Patient Active Problem List   Diagnosis Date Noted  . ESRD on dialysis (HCC) 11/17/2015  . Foot osteomyelitis (HCC) 11/17/2015  . Heel ulcer (HCC) 11/17/2015  . 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 Chest pain 06/20/2015  . Elevated troponin 06/20/2015  . ESRD on hemodialysis (HCC) 06/20/2015  . Hyperkalemia 06/20/2015  . Dementia 06/20/2015  . HTN (hypertension) 06/20/2015  . DM (diabetes mellitus) (HCC) 06/20/2015  . Diabetes mellitus, type II (HCC)   . Chronic kidney disease (CKD), stage V (HCC)     Orientation RESPIRATION BLADDER Height & Weight     (Patient is disoriented. )  O2 (Nasal Cannula (2 L/min) ) Incontinent 5\' 4"  (162.6 cm) 134 lbs.  BEHAVIORAL SYMPTOMS/MOOD NEUROLOGICAL BOWEL NUTRITION STATUS     (None ) Continent Diet (Heart Healthy/Carb Modified )  AMBULATORY STATUS COMMUNICATION OF NEEDS Skin   Extensive Assist (Patient is wheel chair bound ) Verbally Other (Comment) (Pressure Ulcer Unstageable right heel; Pressure Ulcer Unstageable left heel; Pressure Ulcer Stage II coccyx)                       Personal Care Assistance Level of Assistance  Bathing, Feeding, Dressing Bathing Assistance: Maximum assistance Feeding  assistance: Limited assistance Dressing Assistance: Maximum assistance     Functional Limitations Info  Sight, Hearing, Speech Sight Info: Adequate Hearing Info: Adequate Speech Info: Adequate    SPECIAL CARE FACTORS FREQUENCY                       Contractures      Additional Factors Info  Code Status, Allergies Code Status Info:  (Full Code) Allergies Info:  (Shellfish)           Current Medications (11/17/2015):  This is the current hospital active medication list Current Facility-Administered Medications  Medication Dose Route Frequency Provider Last Rate Last Dose  . 0.9 %  sodium chloride infusion  250 mL Intravenous PRN Ihor AustinPavan Pyreddy, MD      . acetaminophen (TYLENOL) tablet 650 mg  650 mg Oral Q6H PRN Ihor AustinPavan Pyreddy, MD       Or  . acetaminophen (TYLENOL) suppository 650 mg  650 mg Rectal Q6H PRN Pavan Pyreddy, MD      . amLODipine (NORVASC) tablet 10 mg  10 mg Oral Daily Ihor AustinPavan Pyreddy, MD   10 mg at 11/17/15 1405  . aspirin EC tablet 81 mg  81 mg Oral Daily Ihor AustinPavan Pyreddy, MD   81 mg at 11/17/15 1405  . clopidogrel (PLAVIX) tablet 75 mg  75 mg Oral Daily Ihor AustinPavan Pyreddy, MD   75 mg at 11/17/15 1404  . diazepam (VALIUM) tablet 5 mg  5 mg Oral Once per day on  Mon Wed Fri Ihor Austin, MD      . famotidine (PEPCID) tablet 20 mg  20 mg Oral Daily Ihor Austin, MD   20 mg at 11/17/15 1405  . feeding supplement (NEPRO CARB STEADY) liquid 237 mL  237 mL Oral Daily Pavan Pyreddy, MD   237 mL at 11/17/15 1406  . heparin ADULT infusion 100 units/mL (25000 units/250 mL)  850 Units/hr Intravenous Continuous Irean Hong, MD      . heparin bolus via infusion 1,800 Units  1,800 Units Intravenous Once Irean Hong, MD      . hydrALAZINE (APRESOLINE) tablet 25 mg  25 mg Oral TID Ihor Austin, MD   25 mg at 11/17/15 1414  . HYDROcodone-acetaminophen (NORCO/VICODIN) 5-325 MG per tablet 1-2 tablet  1-2 tablet Oral Q4H PRN Ihor Austin, MD   1 tablet at 11/17/15 1404  . isosorbide  mononitrate (IMDUR) 24 hr tablet 120 mg  120 mg Oral Daily Ihor Austin, MD   120 mg at 11/17/15 1405  . lisinopril (PRINIVIL,ZESTRIL) tablet 20 mg  20 mg Oral Daily Ihor Austin, MD   20 mg at 11/17/15 1405  . metoprolol (LOPRESSOR) tablet 50 mg  50 mg Oral BID Ihor Austin, MD   50 mg at 11/17/15 1331  . ondansetron (ZOFRAN) tablet 4 mg  4 mg Oral Q6H PRN Ihor Austin, MD       Or  . ondansetron (ZOFRAN) injection 4 mg  4 mg Intravenous Q6H PRN Pavan Pyreddy, MD      . piperacillin-tazobactam (ZOSYN) IVPB 3.375 g  3.375 g Intravenous Q12H Pavan Pyreddy, MD      . polyethylene glycol (MIRALAX / GLYCOLAX) packet 17 g  17 g Oral Daily Pavan Pyreddy, MD      . risperiDONE (RISPERDAL) tablet 2 mg  2 mg Oral Once per day on Mon Wed Fri Pavan Pyreddy, MD      . senna-docusate (Senokot-S) tablet 1 tablet  1 tablet Oral QHS PRN Ihor Austin, MD      . sertraline (ZOLOFT) tablet 50 mg  50 mg Oral Daily Ihor Austin, MD   50 mg at 11/17/15 1405  . sevelamer carbonate (RENVELA) tablet 2,400 mg  2,400 mg Oral TID WC Ihor Austin, MD   2,400 mg at 11/17/15 1404  . sodium chloride 0.9 % injection 3 mL  3 mL Intravenous Q12H Pavan Pyreddy, MD   3 mL at 11/17/15 1331  . sodium chloride 0.9 % injection 3 mL  3 mL Intravenous Q12H Pavan Pyreddy, MD      . sodium chloride 0.9 % injection 3 mL  3 mL Intravenous PRN Ihor Austin, MD      . Melene Muller ON 11/18/2015] vancomycin (VANCOCIN) 500 mg in sodium chloride 0.9 % 100 mL IVPB  500 mg Intravenous Q T,Th,Sa-HD Pavan Pyreddy, MD      . vancomycin (VANCOCIN) 500 mg in sodium chloride 0.9 % 100 mL IVPB  500 mg Intravenous Once Pavan Pyreddy, MD      . vitamin B-12 (CYANOCOBALAMIN) tablet 1,000 mcg  1,000 mcg Oral Daily Ihor Austin, MD   1,000 mcg at 11/17/15 1404     Discharge Medications: Please see discharge summary for a list of discharge medications.  Relevant Imaging Results:  Relevant Lab Results:   Additional Information  (SSN:   [id number])  Verta Ellen Jonas Goh, LCSW

## 2015-11-17 NOTE — Clinical Social Work Note (Addendum)
Clinical Social Work Assessment  Patient Details  Name: Caroline Flores MRN: [id number] Date of Birth: 1920/06/27  Date of referral:  11/17/15               Reason for consult:  Discharge Planning                Permission sought to share information with:   Permission granted to share information::  Yes, Verbal Permission Nicole Kindred (Legal Guardian ) [phone]  Name::        Agency::  [facility]   Relationship::  Renne Musca ([place] [phone]  Contact Information:     Housing/Transportation Living arrangements for the past 2 months:  SNF [place] of Information:  Renne Musca ([place] [phone] Patient Interpreter Needed:    Criminal Activity/Legal Involvement Pertinent to Current Situation/Hospitalization:  No - Comment as needed Significant Relationships:  Renne Musca (Legal Doran Durand Niece ) [phone] Lives with:  SNF: [place] you feel safe going back to the place where you live?  Yes Need for family participation in patient care:  Renne Musca ([place] ) [phone]  Care giving concerns: Patient is a resident at [facility].    Social Worker assessment / plan:  CSW met with patient's legal guardian Renne Musca (Legal Doran Durand Niece ) [phone] at bedside. Patient was in bed asleep. Per Magda Paganini patient is a resident at [facility]. She reports that patient has Medicare and Medicaid.  Per Magda Paganini patient has been a resident at [facility] for the past 2 years. She reports that patient is wheel chair bound and her ulcers has gotten worse. Magda Paganini reports the O2 patient is wearing is "new" and "patient did not wear oxygen at [facility] Per Magda Paganini patient is to return to [facility] at discharge. She requested patient be transported by EMS.   CSW contacted Neoma Laming, Development worker, international aid at [employer]. Per Neoma Laming patient is a LTC resident of [place]. She reports that patient has been  a resident for the "past several years". Neoma Laming reports that patient can return to [facility] at discharge. CSW contacted Neoma Laming a second time to request Legal Guardianship paperwork for patient's chart. CSW left a voicemail and is awaiting a returned phone call.    Employment status:  Retired Forensic scientist:  Information systems manager, Medicaid In [place] PT Recommendations:  Bureau / Referral to community resources:     Patient/Family's Response to care: Renne Musca (Legal Guardian ) is agreeable with patient returning to [place] at discharge.   Patient/Family's Understanding of and Emotional Response to Diagnosis, Current Treatment, and Prognosis:  Magda Paganini was pleasant and grateful for CSW assistance.   Emotional Assessment Appearance:  Appears stated age Attitude/Demeanor/Rapport:    Affect (typically observed):  Appropriate Orientation:   (Patient is disoriented ) Alcohol / Substance use:  Not Applicable Psych involvement (Current and /or in the community):  No (Comment)  Discharge Needs  Concerns to be addressed:  Discharge Planning Concerns Readmission within the last 30 days:  No Current discharge risk:  Chronically ill Barriers to Discharge:  Continued Medical Work up   [employer], [place] 11/17/2015, 4:01 PM

## 2015-11-17 NOTE — Consult Note (Signed)
  Patient seen and evaluated shortly after dialysis Patient does not participate in my exam History comes solely from her grand-niece who states the area of concern has gradually increased in size.  On exam there is a 5cm area of eschar covering the posterior aspect of her left heal. No fluctuance or purulent drainage.  This likely represents a combination of pressure wound and vascular insufficiency. Recommend podiatry consultation. No indication for general surgery intervention at this time.  Please call again if we can be of any assistance.  Ricarda Frameharles Mecca Barga, MD FACS General Surgeon [facility]

## 2015-11-17 NOTE — Progress Notes (Signed)
[facility]  at [facility]   PATIENT NAME: Caroline Flores    MR#:  [id number]  DATE OF BIRTH:  1920/05/31  SUBJECTIVE:  CHIEF COMPLAINT:  Pt is lethargic, opens her eyes and moans  REVIEW OF SYSTEMS:  Unobtainable- as pt is altered  VITALS:  Blood pressure 155/55, pulse 80, temperature 98.2 F (36.8 C), temperature source Oral, resp. rate 16, height 5\' 4"  (1.626 m), weight 61 kg (134 lb 7.7 oz), SpO2 100 %.  PHYSICAL EXAMINATION:  GENERAL:  82 y.o.-year-old patient lying in the bed with no acute distress.  EYES: Pupils equal, round, reactive to light and accommodation. No scleral icterus.  HEENT: Head atraumatic, normocephalic. Oropharynx and nasopharynx clear.  NECK:  Supple, no jugular venous distention. No thyroid enlargement, no tenderness.  LUNGS: Normal breath sounds bilaterally, no wheezing, rales,rhonchi or crepitation. No use of accessory muscles of respiration.  CARDIOVASCULAR: S1, S2 normal. No murmurs, rubs, or gallops.  ABDOMEN: Soft, nontender, nondistended. Bowel sounds present. No organomegaly or mass.  EXTREMITIES: Rt heel - eschar - 9/ 11.5 cm , rt great toe - 15/4 cm eschar . No cyanosis, or clubbing.  NEUROLOGIC: pt is disoriented  PSYCHIATRIC: The patient is lethargic, disoriented SKIN: No obvious rash, lesion, or ulcer.    LABORATORY PANEL:   CBC  Recent Labs Lab 11/17/15 0534  WBC 8.2  HGB 11.3*  HCT 35.7  PLT 170   ------------------------------------------------------------------------------------------------------------------  Chemistries   Recent Labs Lab 11/17/15 0101 11/17/15 0534  NA 138 140  K 3.2* 3.4*  CL 101 100*  CO2 31 31  GLUCOSE 113* 117*  BUN 15 16  CREATININE 2.65* 2.72*  CALCIUM 8.9 9.0  AST 18  --   ALT 14  --   ALKPHOS 77  --   BILITOT 0.8  --    ------------------------------------------------------------------------------------------------------------------  Cardiac  Enzymes No results for input(s): TROPONINI in the last 168 hours. ------------------------------------------------------------------------------------------------------------------  RADIOLOGY:  Ct Head Wo Contrast  11/17/2015  CLINICAL DATA:  Lethargic.  Encephalopathy. EXAM: CT HEAD WITHOUT CONTRAST TECHNIQUE: Contiguous axial images were obtained from the base of the skull through the vertex without intravenous contrast. COMPARISON:  CT scan of September 19, 2015. FINDINGS: Bony calvarium appears intact. Mild diffuse cortical atrophy is noted. Moderate chronic ischemic white matter disease is noted. No mass effect or midline shift is noted. Ventricular size is within normal limits. There is no evidence of mass lesion, hemorrhage or acute infarction. IMPRESSION: Mild diffuse cortical atrophy. Moderate chronic ischemic white matter disease. No acute intracranial abnormality seen. Electronically Signed   By: Lupita RaiderJames  Green Jr, M.D.   On: 11/17/2015 15:27   Dg Chest Port 1 View  11/17/2015  CLINICAL DATA:  Acute onset of generalized chest pain. Initial encounter. EXAM: PORTABLE CHEST 1 VIEW COMPARISON:  Chest radiograph performed 06/19/2015 FINDINGS: The lungs are well-aerated. Mild left basilar opacity may reflect atelectasis or possibly mild pneumonia. Mild vascular congestion is noted. There is no evidence of pleural effusion or pneumothorax. The cardiomediastinal silhouette is within normal limits. A left-sided dual-lumen catheter is noted ending about the cavoatrial junction. No acute osseous abnormalities are seen. IMPRESSION: Mild left basilar airspace opacity may reflect atelectasis or possibly mild pneumonia. Mild vascular congestion noted. Electronically Signed   By: Roanna RaiderJeffery  Chang M.D.   On: 11/17/2015 02:08   Dg Foot Complete Right  11/17/2015  CLINICAL DATA:  Discoloration at the right toes, acute onset. Initial encounter. EXAM: RIGHT FOOT COMPLETE - 3+ VIEW  COMPARISON:  None. FINDINGS: There is no  evidence of fracture or dislocation. The joint spaces are preserved. There is no evidence of talar subluxation; the subtalar joint is unremarkable in appearance. Diffuse vascular calcifications are seen. IMPRESSION: 1. No evidence of fracture or dislocation. 2. Diffuse vascular calcifications seen. Electronically Signed   By: Roanna Raider M.D.   On: 11/17/2015 02:09    EKG:   Orders placed or performed during the hospital encounter of 11/17/15  . ED EKG  . ED EKG  . EKG 12-Lead  . EKG 12-Lead    ASSESSMENT AND PLAN:   IMPRESSION AND PLAN: 82 year old female patient with history of end-stage renal disease on dialysis, hypertension, diabetes mellitus, dementia presented to the ER with right heel ulcer, blackish discoloration of the right foot and toes with poor circulation.  1. Nonhealing right foot ulcer with  possible right foot osteomyelitis/ Gangrene right foot Consult placed to podiatry, doesn't think pt is a surgical candidate  Vascular surgery consult is placed  on IV vancomycin and IV Zosyn antibiotic Anticoagulate patient with IV heparin drip Would consider Palliative care consult   2. Questionable arterial occlusion with poor circulation in the right foot With PVD - consult vascular surgery    3. ESRD on dialysis- f/u with nephrology   4.. Dementia  Supportive care prn   5. HTN - continue metoprolol, lisinopril and hydralazine       All the records are reviewed and case discussed with Care Management/Social Workerr. Management plans discussed with the patient, family and they are in agreement.  CODE STATUS: fc   TOTAL TIME TAKING CARE OF THIS PATIENT: 35  minutes.   POSSIBLE D/C IN 2-3  DAYS, DEPENDING ON CLINICAL CONDITION.   Ramonita Lab M.D on 11/17/2015 at 7:32 PM  Between 7am to 6pm - Pager - [phone] After 6pm go to [email or web address] - password [email or web address]  [facility]  Office  [phone]  CC: Primary care physician; Pcp Not In  System

## 2015-11-17 NOTE — Progress Notes (Signed)
ANTICOAGULATION CONSULT NOTE - Initial Consult  Pharmacy Consult for heparin Indication: PAD  Allergies  Allergen Reactions  . Shellfish Allergy Other (See Comments)    Reaction:  Unknown     Patient Measurements: Height: 5\' 4"  (162.6 cm) IBW/kg (Calculated) : 54.7 Heparin Dosing Weight: 60 kg  Vital Signs: Temp: 98.1 F (36.7 C) (01/06 0056) Temp Source: Oral (01/06 0056) BP: 162/59 mmHg (01/06 0056) Pulse Rate: 73 (01/06 0056)  Labs:  Recent Labs  11/17/15 0101  HGB 11.4*  HCT 35.9  PLT 175  LABPROT 14.5  INR 1.11  CREATININE 2.65*    CrCl cannot be calculated (Unknown ideal weight.).   Medical History: Past Medical History  Diagnosis Date  . HTN (hypertension)   . Diabetes mellitus, type II (HCC)   . Hearing loss   . Memory loss   . Chronic kidney disease (CKD), stage V (HCC)     GFR 2  . Dementia     Medications:  Infusions:  . heparin    . piperacillin-tazobactam (ZOSYN)  IV      Assessment: 89 [facility] Some PAD with extremity discoloration per nursing, pharmacy consulted to dose heparin.   Goal of Therapy:  Heparin level 0.3-0.7 units/ml Monitor platelets by anticoagulation protocol: Yes   Plan:  Give 2000 units bolus x 1 Start heparin infusion at 700 units/hr Check anti-Xa level in 8 hours and daily while on heparin Continue to monitor H&H and platelets  Carola FrostNathan A Shamyra Farias, Pharm.D., BCPS Clinical Pharmacist 11/17/2015,1:49 AM

## 2015-11-17 NOTE — Consult Note (Signed)
Patient Demographics  Caroline Flores, is a 82 y.o. female   MRN: [id number]   DOB - 1920-02-16  Admit Date - 11/17/2015    Outpatient Primary MD for the patient is Pcp Not In System  Consult requested in the Hospital by Ramonita Lab, MD, On 11/17/2015    Reason for consult necrotic decubitus heel ulcer right heel   With History of -  Past Medical History  Diagnosis Date  . HTN (hypertension)   . Diabetes mellitus, type II (HCC)   . Hearing loss   . Memory loss   . Chronic kidney disease (CKD), stage V (HCC)     GFR 2  . Dementia   . Peripheral vascular disease [facility])       Past Surgical History  Procedure Laterality Date  . Back surgery      in for   Chief Complaint  Patient presents with  . Circulatory Problem     HPI  Caroline Flores  is a 82 y.o. female, patient is a resident of white matter does not really ambulate. Has had long-standing heel ulcerations the left is stable but the right is large and necrotic with a large black eschar.   Social History Social History  Substance Use Topics  . Smoking status: Former Games developer  . Smokeless tobacco: Never Used  . Alcohol Use: No     Family History Family History  Problem Relation Age of Onset  . Rheum arthritis Neg Hx   . Osteoarthritis Neg Hx   . Asthma Neg Hx   . Cancer Neg Hx      Prior to Admission medications   Medication Sig Start Date End Date Taking? Authorizing Provider  acetaminophen (TYLENOL) 500 MG tablet Take 1,000 mg by mouth 2 (two) times daily.   Yes Historical Provider, MD  amLODipine (NORVASC) 10 MG tablet Take 10 mg by mouth daily.   Yes Historical Provider, MD  clopidogrel (PLAVIX) 75 MG tablet Take 75 mg by mouth daily.   Yes Historical Provider, MD  Cyanocobalamin (VITAMIN B12) 1000 MCG TBCR Take 1,000 mcg by  mouth daily.    Yes Historical Provider, MD  diazepam (VALIUM) 5 MG tablet Take 5 mg by mouth 3 (three) times a week. Pt uses on Tuesday, Thursday, and Saturday before dialysis.   Yes Historical Provider, MD  hydrALAZINE (APRESOLINE) 25 MG tablet Take 25 mg by mouth 3 (three) times daily.   Yes Historical Provider, MD  isosorbide mononitrate (IMDUR) 120 MG 24 hr tablet Take 120 mg by mouth daily.   Yes Historical Provider, MD  metoprolol (LOPRESSOR) 50 MG tablet Take 50 mg by mouth 2 (two) times daily.   Yes Historical Provider, MD  Nutritional Supplements (FEEDING SUPPLEMENT, NEPRO CARB STEADY,) LIQD Take 237 mLs by mouth daily.   Yes Historical Provider, MD  polyethylene glycol (MIRALAX / GLYCOLAX) packet Take 17 g by mouth daily.   Yes Historical Provider, MD  ranitidine (ZANTAC) 150 MG tablet Take 150 mg by mouth every morning.   Yes Historical Provider, MD  risperiDONE (RISPERDAL) 2 MG tablet Take 2 mg by mouth 3 (three) times a week. Pt uses every Tuesday, Thursday, and Saturday before dialysis.  Yes Historical Provider, MD  sertraline (ZOLOFT) 50 MG tablet Take 50 mg by mouth daily.   Yes Historical Provider, MD  sevelamer carbonate (RENVELA) 800 MG tablet Take 2,400 mg by mouth 3 (three) times daily with meals.   Yes Historical Provider, MD  sodium chloride 0.9 % injection Inject 10 mLs into the vein as needed (when not running fluids).   Yes Historical Provider, MD  traMADol (ULTRAM) 50 MG tablet Take 50 mg by mouth every 8 (eight) hours as needed.   Yes Historical Provider, MD  lisinopril (PRINIVIL,ZESTRIL) 20 MG tablet Take 20 mg by mouth daily. Reported on 11/17/2015    Historical Provider, MD    Anti-infectives    Start     Dose/Rate Route Frequency Ordered Stop   11/21/15 1200  vancomycin (VANCOCIN) 500 mg in sodium chloride 0.9 % 100 mL IVPB     500 mg 100 mL/hr over 60 Minutes Intravenous Every T-Th-Sa (Hemodialysis) 11/17/15 0414     11/17/15 1600  vancomycin (VANCOCIN) 500 mg in  sodium chloride 0.9 % 100 mL IVPB     500 mg 100 mL/hr over 60 Minutes Intravenous  Once 11/17/15 1506     11/17/15 1400  piperacillin-tazobactam (ZOSYN) IVPB 3.375 g     3.375 g 12.5 mL/hr over 240 Minutes Intravenous Every 12 hours 11/17/15 0414     11/17/15 0300  vancomycin (VANCOCIN) 1,250 mg in sodium chloride 0.9 % 250 mL IVPB     1,250 mg 166.7 mL/hr over 90 Minutes Intravenous  Once 11/17/15 0226 11/17/15 0414   11/17/15 0145  piperacillin-tazobactam (ZOSYN) IVPB 3.375 g     3.375 g 12.5 mL/hr over 240 Minutes Intravenous  Once 11/17/15 0133 11/17/15 0634      Scheduled Meds: . amLODipine  10 mg Oral Daily  . aspirin EC  81 mg Oral Daily  . clopidogrel  75 mg Oral Daily  . [START ON 11/18/2015] diazepam  5 mg Oral Q T,Th,Sa-HD  . famotidine  20 mg Oral Daily  . feeding supplement (NEPRO CARB STEADY)  237 mL Oral Daily  . hydrALAZINE  25 mg Oral TID  . isosorbide mononitrate  120 mg Oral Daily  . lisinopril  20 mg Oral Daily  . metoprolol  50 mg Oral BID  . piperacillin-tazobactam (ZOSYN)  IV  3.375 g Intravenous Q12H  . polyethylene glycol  17 g Oral Daily  . [START ON 11/18/2015] risperiDONE  2 mg Oral Q T,Th,Sa-HD  . sertraline  50 mg Oral Daily  . sevelamer carbonate  2,400 mg Oral TID WC  . sodium chloride  3 mL Intravenous Q12H  . sodium chloride  3 mL Intravenous Q12H  . [START ON 11/21/2015] vancomycin  500 mg Intravenous Q T,Th,Sa-HD  . vancomycin  500 mg Intravenous Once  . vitamin B-12  1,000 mcg Oral Daily   Continuous Infusions: . heparin 850 Units/hr (11/17/15 1619)   PRN Meds:.sodium chloride, acetaminophen **OR** acetaminophen, HYDROcodone-acetaminophen, ondansetron **OR** ondansetron (ZOFRAN) IV, senna-docusate, sodium chloride  Allergies  Allergen Reactions  . Shellfish Allergy Other (See Comments)    Reaction:  Unknown     Physical Exam  Vitals  Blood pressure 155/55, pulse 80, temperature 98.2 F (36.8 C), temperature source Oral, resp. rate  16, height 5\' 4"  (1.626 m), weight 61 kg (134 lb 7.7 oz), SpO2 100 %.  Lower Extremity exam:  Vascular: DP and PT pulses to both lower extremities are nonpalpable.  Dermatological: Left heel has a basically stable healed wound  is not open or draining  at this point. Right heel has a large necrotic eschar to the posterior posterior lateral aspect of the heel approximately 8 x 6 cm in size. Very hard blackened gangrenous necrotic tissue to the heel itself. This is likely a very deep penetrating wound and likely involves the deeper soft tissue and possibly bone  Neurological: Patient doesn't seem to have much pain associated with this.  Ortho: Patient is non-ambulatory. She is unresponsive at this point. I feel like this wound and likely involves bone as well as possibly Achilles tendon in the region.  Data Review  CBC  Recent Labs Lab 11/17/15 0101 11/17/15 0534  WBC 8.4 8.2  HGB 11.4* 11.3*  HCT 35.9 35.7  PLT 175 170  MCV 89.1 87.8  MCH 28.3 27.7  MCHC 31.7* 31.6*  RDW 16.1* 16.3*  LYMPHSABS 1.0  --   MONOABS 0.7  --   EOSABS 0.1  --   BASOSABS 0.0  --    ------------------------------------------------------------------------------------------------------------------  Chemistries   Recent Labs Lab 11/17/15 0101 11/17/15 0534  NA 138 140  K 3.2* 3.4*  CL 101 100*  CO2 31 31  GLUCOSE 113* 117*  BUN 15 16  CREATININE 2.65* 2.72*  CALCIUM 8.9 9.0  AST 18  --   ALT 14  --   ALKPHOS 77  --   BILITOT 0.8  --    ------------------------------------------------------------------------------------------------------------------ estimated creatinine clearance is 11.2 mL/min (by C-G formula based on Cr of 2.72). ------------------------------------------------------------------------------------------------------------------ No results for input(s): TSH, T4TOTAL, T3FREE, THYROIDAB in the last 72 hours.  Invalid input(s): FREET3   Coagulation profile  Recent  Labs Lab 11/17/15 0101  INR 1.11   ------------------------------------------------------------------------------------------------------------------- No results for input(s): DDIMER in the last 72 hours. -------------------------------------------------------------------------------------------------------------------  Cardiac Enzymes No results for input(s): CKMB, TROPONINI, MYOGLOBIN in the last 168 hours.  Invalid input(s): CK ------------------------------------------------------------------------------------------------------------------ Invalid input(s): POCBNP   ---------------------------------------------------------------------------------------------------------------  Urinalysis    Component Value Date/Time   COLORURINE AMBER* 09/19/2015 1743   COLORURINE Yellow 03/29/2014 1708   APPEARANCEUR CLOUDY* 09/19/2015 1743   APPEARANCEUR Hazy 03/29/2014 1708   LABSPEC QUANTITY NOT SUFFICIENT, UNABLE TO PERFORM TEST 09/19/2015 1743   LABSPEC 1.017 03/29/2014 1708   PHURINE QUANTITY NOT SUFFICIENT, UNABLE TO PERFORM TEST 09/19/2015 1743   PHURINE 6.0 03/29/2014 1708   GLUCOSEU QUANTITY NOT SUFFICIENT, UNABLE TO PERFORM TEST* 09/19/2015 1743   GLUCOSEU Negative 03/29/2014 1708   HGBUR QUANTITY NOT SUFFICIENT, UNABLE TO PERFORM TEST* 09/19/2015 1743   HGBUR Negative 03/29/2014 1708   BILIRUBINUR QUANTITY NOT SUFFICIENT, UNABLE TO PERFORM TEST* 09/19/2015 1743   BILIRUBINUR Negative 03/29/2014 1708   KETONESUR QUANTITY NOT SUFFICIENT, UNABLE TO PERFORM TEST* 09/19/2015 1743   KETONESUR Negative 03/29/2014 1708   PROTEINUR QUANTITY NOT SUFFICIENT, UNABLE TO PERFORM TEST* 09/19/2015 1743   PROTEINUR Negative 03/29/2014 1708   NITRITE QUANTITY NOT SUFFICIENT, UNABLE TO PERFORM TEST* 09/19/2015 1743   NITRITE Negative 03/29/2014 1708   LEUKOCYTESUR QUANTITY NOT SUFFICIENT, UNABLE TO PERFORM TEST* 09/19/2015 1743   LEUKOCYTESUR 3+ 03/29/2014 1708     Imaging results:   Ct  Head Wo Contrast  11/17/2015  CLINICAL DATA:  Lethargic.  Encephalopathy. EXAM: CT HEAD WITHOUT CONTRAST TECHNIQUE: Contiguous axial images were obtained from the base of the skull through the vertex without intravenous contrast. COMPARISON:  CT scan of September 19, 2015. FINDINGS: Bony calvarium appears intact. Mild diffuse cortical atrophy is noted. Moderate chronic ischemic white matter disease is noted. No mass effect or midline  shift is noted. Ventricular size is within normal limits. There is no evidence of mass lesion, hemorrhage or acute infarction. IMPRESSION: Mild diffuse cortical atrophy. Moderate chronic ischemic white matter disease. No acute intracranial abnormality seen. Electronically Signed   By: Lupita Raider, M.D.   On: 11/17/2015 15:27   Dg Chest Port 1 View  11/17/2015  CLINICAL DATA:  Acute onset of generalized chest pain. Initial encounter. EXAM: PORTABLE CHEST 1 VIEW COMPARISON:  Chest radiograph performed 06/19/2015 FINDINGS: The lungs are well-aerated. Mild left basilar opacity may reflect atelectasis or possibly mild pneumonia. Mild vascular congestion is noted. There is no evidence of pleural effusion or pneumothorax. The cardiomediastinal silhouette is within normal limits. A left-sided dual-lumen catheter is noted ending about the cavoatrial junction. No acute osseous abnormalities are seen. IMPRESSION: Mild left basilar airspace opacity may reflect atelectasis or possibly mild pneumonia. Mild vascular congestion noted. Electronically Signed   By: Roanna Raider M.D.   On: 11/17/2015 02:08   Dg Foot Complete Right  11/17/2015  CLINICAL DATA:  Discoloration at the right toes, acute onset. Initial encounter. EXAM: RIGHT FOOT COMPLETE - 3+ VIEW COMPARISON:  None. FINDINGS: There is no evidence of fracture or dislocation. The joint spaces are preserved. There is no evidence of talar subluxation; the subtalar joint is unremarkable in appearance. Diffuse vascular calcifications are seen.  IMPRESSION: 1. No evidence of fracture or dislocation. 2. Diffuse vascular calcifications seen. Electronically Signed   By: Roanna Raider M.D.   On: 11/17/2015 02:09     Assessment & Plan: Left heel wound is stable and healed. Right wound is severely necrotic and gangrenous eschar to the posterior and lateral aspect of the heel. The wound is large 8 x 6 cm and likely involves deeper tissues. She also likely has severe peripheral vascular disease. She is unresponsive when spoke to her and worked at her foot exam. Plan: I spoke with her power of attorney have think it's her niece. She has multiple medical problems and issues and is not a very good candidate for any type of surgical approach. Her niece understands this. I will get a vascular consult for tomorrow just to confirm her poor circulation. Also to confirm whether or not she is a candidate for any vascular reconstruction. The doctor's nurses at [facility] have spoken to the niece regarding palliative care.  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   Thank you for the consult, we will follow the patient with you in the Hospital.   Epimenio Sarin M.D on 11/17/2015 at 6:03 PM

## 2015-11-17 NOTE — ED Provider Notes (Signed)
[facility] Emergency Department Provider Note  ____________________________________________  Time seen: Approximately 1:00 AM  I have reviewed the triage vital signs and the nursing notes.   HISTORY  Chief Complaint Circulatory Problem    HPI Caroline Flores is a 82 y.o. female brought to the ED from nursing facility with a chief complaint of not taking her medicines and discoloration to her toes. Per her staff from nursing facility, family is in talks to determineDO NOT RESUSCITATE status for patient, but she remains full code currently. She has a compensated medical history including diabetes, hypertension, renal failure on hemodialysis Tuesday, Thursday, Saturday. Patient states she took dialysis yesterday. Currently has a right diabetic heel ulcer which is being treated at the nursing facility. Nursing staff notes discoloration to patient's toes, unknown onset. Patient complains of "pain all over". Denies recent fever, chills, cough, chest pain, shortness of breath. Denies recent travel or trauma.   Past Medical History  Diagnosis Date  . HTN (hypertension)   . Diabetes mellitus, type II (HCC)   . Hearing loss   . Memory loss   . Chronic kidney disease (CKD), stage V (HCC)     GFR 2  . Dementia   . Peripheral vascular disease [facility])     Patient Active Problem List   Diagnosis Date Noted  . ESRD on dialysis (HCC) 11/17/2015  . Foot osteomyelitis (HCC) 11/17/2015  . Heel ulcer (HCC) 11/17/2015  . 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 Chest pain 06/20/2015  . Elevated troponin 06/20/2015  . ESRD on hemodialysis (HCC) 06/20/2015  . Hyperkalemia 06/20/2015  . Dementia 06/20/2015  . HTN (hypertension) 06/20/2015  . DM (diabetes mellitus) (HCC) 06/20/2015  . Diabetes mellitus, type II (HCC)   . Chronic kidney disease (CKD), stage V [facility]     Past Surgical History  Procedure Laterality Date  . Back surgery       Current Outpatient Rx  Name  Route  Sig  Dispense  Refill  . acetaminophen (TYLENOL) 500 MG tablet   Oral   Take 1,000 mg by mouth 2 (two) times daily.         Marland Kitchen amLODipine (NORVASC) 10 MG tablet   Oral   Take 10 mg by mouth daily.         . clopidogrel (PLAVIX) 75 MG tablet   Oral   Take 75 mg by mouth daily.         . Cyanocobalamin (VITAMIN B12) 1000 MCG TBCR   Oral   Take 1,000 mcg by mouth daily.          . diazepam (VALIUM) 5 MG tablet   Oral   Take 5 mg by mouth 3 (three) times a week. Pt uses on Tuesday, Thursday, and Saturday before dialysis.         . hydrALAZINE (APRESOLINE) 25 MG tablet   Oral   Take 25 mg by mouth 3 (three) times daily.         . isosorbide mononitrate (IMDUR) 120 MG 24 hr tablet   Oral   Take 120 mg by mouth daily.         . metoprolol (LOPRESSOR) 50 MG tablet   Oral   Take 50 mg by mouth 2 (two) times daily.         . Nutritional Supplements (FEEDING SUPPLEMENT, NEPRO CARB STEADY,) LIQD   Oral   Take 237 mLs by mouth daily.         Marland Kitchen  polyethylene glycol (MIRALAX / GLYCOLAX) packet   Oral   Take 17 g by mouth daily.         . ranitidine (ZANTAC) 150 MG tablet   Oral   Take 150 mg by mouth every morning.         . risperiDONE (RISPERDAL) 2 MG tablet   Oral   Take 2 mg by mouth 3 (three) times a week. Pt uses every Tuesday, Thursday, and Saturday before dialysis.         Marland Kitchen sertraline (ZOLOFT) 50 MG tablet   Oral   Take 50 mg by mouth daily.         . sevelamer carbonate (RENVELA) 800 MG tablet   Oral   Take 2,400 mg by mouth 3 (three) times daily with meals.         . sodium chloride 0.9 % injection   Intravenous   Inject 10 mLs into the vein as needed (when not running fluids).         . traMADol (ULTRAM) 50 MG tablet   Oral   Take 50 mg by mouth every 8 (eight) hours as needed.         Marland Kitchen lisinopril (PRINIVIL,ZESTRIL) 20 MG tablet   Oral   Take 20 mg by mouth daily. Reported on  11/17/2015           Allergies Shellfish allergy  Family History  Problem Relation Age of Onset  . Rheum arthritis Neg Hx   . Osteoarthritis Neg Hx   . Asthma Neg Hx   . Cancer Neg Hx     Social History Social History  Substance Use Topics  . Smoking status: Former Games developer  . Smokeless tobacco: Never Used  . Alcohol Use: No    Review of Systems Constitutional: No fever/chills Eyes: No visual changes. ENT: No sore throat. Cardiovascular: Denies chest pain. Respiratory: Denies shortness of breath. Gastrointestinal: No abdominal pain.  No nausea, no vomiting.  No diarrhea.  No constipation. Genitourinary: Negative for dysuria. Musculoskeletal: Positive for right heel ulcer and discoloration to right toes. Negative for back pain. Skin: Negative for rash. Neurological: Negative for headaches, focal weakness or numbness.  10-point ROS otherwise negative.  ____________________________________________   PHYSICAL EXAM:  VITAL SIGNS: ED Triage Vitals  Enc Vitals Group     BP 11/17/15 0056 162/59 mmHg     Pulse Rate 11/17/15 0056 73     Resp --      Temp 11/17/15 0056 98.1 F (36.7 C)     Temp Source 11/17/15 0056 Oral     SpO2 11/17/15 0056 100 %     Weight --      Height 11/17/15 0056 5\' 4"  (1.626 m)     Head Cir --      Peak Flow --      Pain Score 11/17/15 0058 0     Pain Loc --      Pain Edu? --      Excl. in GC? --     Constitutional: Alert and oriented. Chronically ill appearing and in no acute distress. Eyes: Conjunctivae are normal. PERRL. EOMI. Head: Atraumatic. Nose: No congestion/rhinnorhea. Mouth/Throat: Mucous membranes are moist.  Oropharynx non-erythematous. Neck: No stridor.   Cardiovascular: Normal rate, regular rhythm. II/VI SEM.  Poor peripheral circulation. Respiratory: Normal respiratory effort.  No retractions. Lungs CTAB. Gastrointestinal: Soft and nontender. No distention. No abdominal bruits. No CVA tenderness. Musculoskeletal:  Large right posterior heel ulcer. Right foot cold to touch.  Delayed capillary refill. Discoloration of toes. Unable to palpate peripheral pulses nor obtain them via Doppler. Neurologic:  Normal speech and language. No gross focal neurologic deficits are appreciated.  Skin:  Skin is warm, dry and intact. No rash noted. Psychiatric: Mood and affect are normal. Speech and behavior are normal.  ____________________________________________   LABS (all labs ordered are listed, but only abnormal results are displayed)  Labs Reviewed  CBC WITH DIFFERENTIAL/PLATELET - Abnormal; Notable for the following:    Hemoglobin 11.4 (*)    MCHC 31.7 (*)    RDW 16.1 (*)    Neutro Abs 6.6 (*)    All other components within normal limits  COMPREHENSIVE METABOLIC PANEL - Abnormal; Notable for the following:    Potassium 3.2 (*)    Glucose, Bld 113 (*)    Creatinine, Ser 2.65 (*)    Total Protein 6.3 (*)    Albumin 2.3 (*)    GFR calc non Af Amer 14 (*)    GFR calc Af Amer 17 (*)    All other components within normal limits  APTT - Abnormal; Notable for the following:    aPTT 39 (*)    All other components within normal limits  CULTURE, BLOOD (ROUTINE X 2)  CULTURE, BLOOD (ROUTINE X 2)  PROTIME-INR  GLUCOSE, CAPILLARY  HEPARIN LEVEL (UNFRACTIONATED)   ____________________________________________  EKG  ED ECG REPORT I, Natashia Roseman J, the attending physician, personally viewed and interpreted this ECG.   Date: 11/17/2015  EKG Time: 0057  Rate: 67  Rhythm: normal EKG, normal sinus rhythm  Axis: WNL  Intervals:none  ST&T Change: Nonspecific  ____________________________________________  RADIOLOGY  Portable chest x-ray (viewed by me, interpreted per Dr. Cherly Hensen): Mild left basilar airspace opacity may reflect atelectasis or possibly mild pneumonia. Mild vascular congestion noted.  Right foot x-rays (viewed by me, interpreted per Dr. Cherly Hensen): 1. No evidence of fracture or dislocation. 2.  Diffuse vascular calcifications seen. ____________________________________________   PROCEDURES  Procedure(s) performed: None  Critical Care performed: Yes, see critical care note(s)   CRITICAL CARE Performed by: Irean Hong   Total critical care time: 30 minutes  Critical care time was exclusive of separately billable procedures and treating other patients.  Critical care was necessary to treat or prevent imminent or life-threatening deterioration.  Critical care was time spent personally by me on the following activities: development of treatment plan with patient and/or surrogate as well as nursing, discussions with consultants, evaluation of patient's response to treatment, examination of patient, obtaining history from patient or surrogate, ordering and performing treatments and interventions, ordering and review of laboratory studies, ordering and review of radiographic studies, pulse oximetry and re-evaluation of patient's condition.  ____________________________________________   INITIAL IMPRESSION / ASSESSMENT AND PLAN / ED COURSE  Pertinent labs & imaging results that were available during my care of the patient were reviewed by me and considered in my medical decision making (see chart for details).  82 year old female with a history of diabetes, hypertension, renal failure who presents with large right heel wound, cold right foot and discoloration to right toes. Discussed case with Dr. Wyn Quaker (vascular surgery) who agrees with initiation of heparin drip and admission to the hospitalist service. Discussed with Dr. Wendi Snipes who will evaluate patient in the ED for admission. ____________________________________________   FINAL CLINICAL IMPRESSION(S) / ED DIAGNOSES  Final diagnoses:  Diabetic ulcer of right foot associated with type 2 diabetes mellitus (HCC)  PAD (peripheral artery disease) (HCC)  ESRD on hemodialysis (HCC)  Dementia, without behavioral disturbance  Type  2 diabetes mellitus with other specified complication (HCC)      Irean HongJade J Deonta Bomberger, MD 11/17/15 [phone]

## 2015-11-17 NOTE — ED Notes (Signed)
[facility]/o "full code status" per [facility]. [facility] reports the pt has stable vital signs and underwent dialysis on Wednesday. Pt states she "will take" her medications but EMS reports the facility was withholding medications d/t patient refusal. Pt is A&O, answering all questions appropriately. Pt denies any pain, but some discolorations is noted to the pt's toes (pressure reducing boots in place upon arrival).

## 2015-11-17 NOTE — Progress Notes (Signed)
Pt. Confused. Admission incomplete.

## 2015-11-17 NOTE — Progress Notes (Signed)
Spoke with Dr. Anne HahnWillis pt not alert enough to take her medications orally. No new orders.

## 2015-11-17 NOTE — H&P (Signed)
[facility] [place] [facility]   PATIENT NAME: Caroline Flores    MR#:  [id number]  DATE OF BIRTH:  1920-11-01  DATE OF ADMISSION:  11/17/2015  PRIMARY CARE PHYSICIAN: Pcp Not In System   REQUESTING/REFERRING PHYSICIAN:   CHIEF COMPLAINT:   Chief Complaint  Patient presents with  . Circulatory Problem    HISTORY OF PRESENT ILLNESS: Berenize Gatlin  is a 82 y.o. female with a known history of hypertension, diabetes mellitus type 2, dementia, peripheral vascular disease, end-stage renal disease on dialysis was referred from [facility] facility when they found patient was lethargic. And has dementia and is not completely oriented to time place and person. Patient has blackish discoloration of the right forefoot and toes. She has a nonhealing ulcer in the right heel. Patient is a very poor historian. She is able to respond to painful stimuli and loud verbal commands. Patient was evaluated with an arterial Doppler of the right lower extremity in the emergency room and poor circulation was noted. Right foot pulses were feeble. Right foot was also cold to touch. Patient was started on IV heparin drip and IV antibiotics were given in the emergency room. His was discussed by ER attending with vascular surgeon and in view of renal failure CT angiogram of the right lower extremity could not be done.No family member was available at bedside and most of the information was obtained from from the medical chart.  PAST MEDICAL HISTORY:   Past Medical History  Diagnosis Date  . HTN (hypertension)   . Diabetes mellitus, type II (HCC)   . Hearing loss   . Memory loss   . Chronic kidney disease (CKD), stage V (HCC)     GFR 2  . Dementia   . Peripheral vascular disease (HCC)     PAST SURGICAL HISTORY: Past Surgical History  Procedure Laterality Date  . Back surgery      SOCIAL HISTORY:  Social History  Substance Use Topics  . Smoking status: Former Games developer  . Smokeless  tobacco: Never Used  . Alcohol Use: No    FAMILY HISTORY:  Family History  Problem Relation Age of Onset  . Rheum arthritis Neg Hx   . Osteoarthritis Neg Hx   . Asthma Neg Hx   . Cancer Neg Hx     DRUG ALLERGIES:  Allergies  Allergen Reactions  . Shellfish Allergy Other (See Comments)    Reaction:  Unknown     REVIEW OF SYSTEMS:  Could not be obtained as patient is demented. MEDICATIONS AT HOME:  Prior to Admission medications   Medication Sig Start Date End Date Taking? Authorizing Provider  acetaminophen (TYLENOL) 500 MG tablet Take 1,000 mg by mouth 2 (two) times daily.   Yes Historical Provider, MD  amLODipine (NORVASC) 10 MG tablet Take 10 mg by mouth daily.   Yes Historical Provider, MD  clopidogrel (PLAVIX) 75 MG tablet Take 75 mg by mouth daily.   Yes Historical Provider, MD  Cyanocobalamin (VITAMIN B12) 1000 MCG TBCR Take 1,000 mcg by mouth daily.    Yes Historical Provider, MD  diazepam (VALIUM) 5 MG tablet Take 5 mg by mouth 3 (three) times a week. Pt uses on Tuesday, Thursday, and Saturday before dialysis.   Yes Historical Provider, MD  hydrALAZINE (APRESOLINE) 25 MG tablet Take 25 mg by mouth 3 (three) times daily.   Yes Historical Provider, MD  isosorbide mononitrate (IMDUR) 120 MG 24 hr tablet Take 120 mg by mouth  daily.   Yes Historical Provider, MD  metoprolol (LOPRESSOR) 50 MG tablet Take 50 mg by mouth 2 (two) times daily.   Yes Historical Provider, MD  Nutritional Supplements (FEEDING SUPPLEMENT, NEPRO CARB STEADY,) LIQD Take 237 mLs by mouth daily.   Yes Historical Provider, MD  polyethylene glycol (MIRALAX / GLYCOLAX) packet Take 17 g by mouth daily.   Yes Historical Provider, MD  ranitidine (ZANTAC) 150 MG tablet Take 150 mg by mouth every morning.   Yes Historical Provider, MD  risperiDONE (RISPERDAL) 2 MG tablet Take 2 mg by mouth 3 (three) times a week. Pt uses every Tuesday, Thursday, and Saturday before dialysis.   Yes Historical Provider, MD   sertraline (ZOLOFT) 50 MG tablet Take 50 mg by mouth daily.   Yes Historical Provider, MD  sevelamer carbonate (RENVELA) 800 MG tablet Take 2,400 mg by mouth 3 (three) times daily with meals.   Yes Historical Provider, MD  sodium chloride 0.9 % injection Inject 10 mLs into the vein as needed (when not running fluids).   Yes Historical Provider, MD  traMADol (ULTRAM) 50 MG tablet Take 50 mg by mouth every 8 (eight) hours as needed.   Yes Historical Provider, MD  lisinopril (PRINIVIL,ZESTRIL) 20 MG tablet Take 20 mg by mouth daily. Reported on 11/17/2015    Historical Provider, MD      PHYSICAL EXAMINATION:   VITAL SIGNS: Blood pressure 162/59, pulse 73, temperature 98.1 F (36.7 C), temperature source Oral, height 5\' 4"  (1.626 m), SpO2 100 %.  GENERAL:  82 y.o.-year-old patient lying in the bed with no acute distress.  EYES: Pupils equal, round, reactive to light and accommodation. No scleral icterus. Extraocular muscles intact.  HEENT: Head atraumatic, normocephalic. Oropharynx and nasopharynx clear.  NECK:  Supple, no jugular venous distention. No thyroid enlargement, no tenderness.  LUNGS: Normal breath sounds bilaterally, no wheezing, rales,rhonchi or crepitation. No use of accessory muscles of respiration.  On the chest wall vascath noted for dialysis access. CARDIOVASCULAR: S1, S2 normal. No murmurs, rubs, or gallops.  ABDOMEN: Soft, nontender, nondistended. Bowel sounds present. No organomegaly or mass.  EXTREMITIES: right foot blackish discoloration of foot,toes.Cold to touch,poor pedal pulse on the right foot.Heel ulcer on the right foot NEUROLOGIC: Demented,not oriented to time,place and person. PSYCHIATRIC: could not be assessed. SKIN: Heel ulcer right foot Blackish discoloration of right foot and toes.  LABORATORY PANEL:   CBC  Recent Labs Lab 11/17/15 0101  WBC 8.4  HGB 11.4*  HCT 35.9  PLT 175  MCV 89.1  MCH 28.3  MCHC 31.7*  RDW 16.1*  LYMPHSABS 1.0  MONOABS  0.7  EOSABS 0.1  BASOSABS 0.0   ------------------------------------------------------------------------------------------------------------------  Chemistries   Recent Labs Lab 11/17/15 0101  NA 138  K 3.2*  CL 101  CO2 31  GLUCOSE 113*  BUN 15  CREATININE 2.65*  CALCIUM 8.9  AST 18  ALT 14  ALKPHOS 77  BILITOT 0.8   ------------------------------------------------------------------------------------------------------------------ CrCl cannot be calculated (Unknown ideal weight.). ------------------------------------------------------------------------------------------------------------------ No results for input(s): TSH, T4TOTAL, T3FREE, THYROIDAB in the last 72 hours.  Invalid input(s): FREET3   Coagulation profile  Recent Labs Lab 11/17/15 0101  INR 1.11   ------------------------------------------------------------------------------------------------------------------- No results for input(s): DDIMER in the last 72 hours. -------------------------------------------------------------------------------------------------------------------  Cardiac Enzymes No results for input(s): CKMB, TROPONINI, MYOGLOBIN in the last 168 hours.  Invalid input(s): CK ------------------------------------------------------------------------------------------------------------------ Invalid input(s): POCBNP  ---------------------------------------------------------------------------------------------------------------  Urinalysis    Component Value Date/Time   COLORURINE AMBER* 09/19/2015 1743  COLORURINE Yellow 03/29/2014 1708   APPEARANCEUR CLOUDY* 09/19/2015 1743   APPEARANCEUR Hazy 03/29/2014 1708   LABSPEC QUANTITY NOT SUFFICIENT, UNABLE TO PERFORM TEST 09/19/2015 1743   LABSPEC 1.017 03/29/2014 1708   PHURINE QUANTITY NOT SUFFICIENT, UNABLE TO PERFORM TEST 09/19/2015 1743   PHURINE 6.0 03/29/2014 1708   GLUCOSEU QUANTITY NOT SUFFICIENT, UNABLE TO PERFORM TEST*  09/19/2015 1743   GLUCOSEU Negative 03/29/2014 1708   HGBUR QUANTITY NOT SUFFICIENT, UNABLE TO PERFORM TEST* 09/19/2015 1743   HGBUR Negative 03/29/2014 1708   BILIRUBINUR QUANTITY NOT SUFFICIENT, UNABLE TO PERFORM TEST* 09/19/2015 1743   BILIRUBINUR Negative 03/29/2014 1708   KETONESUR QUANTITY NOT SUFFICIENT, UNABLE TO PERFORM TEST* 09/19/2015 1743   KETONESUR Negative 03/29/2014 1708   PROTEINUR QUANTITY NOT SUFFICIENT, UNABLE TO PERFORM TEST* 09/19/2015 1743   PROTEINUR Negative 03/29/2014 1708   NITRITE QUANTITY NOT SUFFICIENT, UNABLE TO PERFORM TEST* 09/19/2015 1743   NITRITE Negative 03/29/2014 1708   LEUKOCYTESUR QUANTITY NOT SUFFICIENT, UNABLE TO PERFORM TEST* 09/19/2015 1743   LEUKOCYTESUR 3+ 03/29/2014 1708     RADIOLOGY: Dg Chest Port 1 View  11/17/2015  CLINICAL DATA:  Acute onset of generalized chest pain. Initial encounter. EXAM: PORTABLE CHEST 1 VIEW COMPARISON:  Chest radiograph performed 06/19/2015 FINDINGS: The lungs are well-aerated. Mild left basilar opacity may reflect atelectasis or possibly mild pneumonia. Mild vascular congestion is noted. There is no evidence of pleural effusion or pneumothorax. The cardiomediastinal silhouette is within normal limits. A left-sided dual-lumen catheter is noted ending about the cavoatrial junction. No acute osseous abnormalities are seen. IMPRESSION: Mild left basilar airspace opacity may reflect atelectasis or possibly mild pneumonia. Mild vascular congestion noted. Electronically Signed   By: Roanna RaiderJeffery  Chang M.D.   On: 11/17/2015 02:08   Dg Foot Complete Right  11/17/2015  CLINICAL DATA:  Discoloration at the right toes, acute onset. Initial encounter. EXAM: RIGHT FOOT COMPLETE - 3+ VIEW COMPARISON:  None. FINDINGS: There is no evidence of fracture or dislocation. The joint spaces are preserved. There is no evidence of talar subluxation; the subtalar joint is unremarkable in appearance. Diffuse vascular calcifications are seen.  IMPRESSION: 1. No evidence of fracture or dislocation. 2. Diffuse vascular calcifications seen. Electronically Signed   By: Roanna RaiderJeffery  Chang M.D.   On: 11/17/2015 02:09    EKG: Orders placed or performed during the hospital encounter of 11/17/15  . ED EKG  . ED EKG  . EKG 12-Lead  . EKG 12-Lead    IMPRESSION AND PLAN: 82 year old female patient with history of end-stage renal disease on dialysis, hypertension, diabetes mellitus, dementia presented to the ER with right heel ulcer, blackish discoloration of the right foot and toes with poor circulation. Admitting diagnosis 1. Nonhealing right foot ulcer 2. Possible right foot osteomyelitis 3. Gangrene right foot 4. Questionable arterial occlusion with poor circulation in the right foot 5. Referral vascular disease 6 ESRD on dialysis 7. Dementia Treatment plan 1. Admit patient to telemetry under inpatient service 2. Add patient on IV vancomycin and IV Zosyn antibiotic 3. Surgical consult for evaluation of nonhealing right foot ulcer for possible amputation or debridement 4. Medical management of diabetes mellitus 5. Nephrology consult for dialysis 6. Anticoagulate patient with IV heparin drip 7. Supportive care  All the records are reviewed and case discussed with ED provider. Management plans discussed with the patient, family and they are in agreement.  CODE STATUS:FULL    TOTAL TIME TAKING CARE OF THIS PATIENT: 50minutes.    Ihor AustinPavan Jamariyah Johannsen M.D on 11/17/2015  at 2:26 AM  Between 7am to 6pm - Pager - [phone]  After 6pm go to [email or web address] - password [email or web address]  [facility]  Office  [phone]  CC: Primary care physician; Pcp Not In System

## 2015-11-17 NOTE — Progress Notes (Signed)
[facility]  ROUNDING NOTE   Subjective:   Seen and examined on hemodialysis. Tolerating treatment well. UF of 0.5Litres  Admitted last night for lethargy and altered mental status. Currently closing eyes and not answering questions appropriately.   Objective:  Vital signs in last 24 hours:  Temp:  [97.4 F (36.3 C)-98.1 F (36.7 C)] 97.4 F (36.3 C) (01/06 0813) Pulse Rate:  [69-74] 69 (01/06 0813) Resp:  [16-18] 18 (01/06 0813) BP: (151-182)/(51-63) 182/63 mmHg (01/06 0813) SpO2:  [100 %] 100 % (01/06 0813) Weight:  [61 kg (134 lb 7.7 oz)] 61 kg (134 lb 7.7 oz) (01/06 1000)  Weight change:  Filed Weights   11/17/15 1000  Weight: 61 kg (134 lb 7.7 oz)    Intake/Output: I/O last 3 completed shifts: In: 1 [IV Piggyback:50] Out: -    Intake/Output this shift:     Physical Exam: General: NAD, laying in bed, somnulent  Head: Normocephalic, atraumatic. Moist oral mucosal membranes  Eyes: Anicteric, PERRL  Neck: Supple, trachea midline  Lungs:  Clear to auscultation  Heart: Regular rate and rhythm  Abdomen:  Soft, nontender  Extremities: no peripheral edema.  Neurologic: Somnulent, follows commands, alert to self only  Skin: +sacral decubitus  Access: LIJ permcath    Basic Metabolic Panel:  Recent Labs Lab 11/17/15 0101 11/17/15 0534  NA 138 140  K 3.2* 3.4*  CL 101 100*  CO2 31 31  GLUCOSE 113* 117*  BUN 15 16  CREATININE 2.65* 2.72*  CALCIUM 8.9 9.0    Liver Function Tests:  Recent Labs Lab 11/17/15 0101  AST 18  ALT 14  ALKPHOS 77  BILITOT 0.8  PROT 6.3*  ALBUMIN 2.3*   No results for input(s): LIPASE, AMYLASE in the last 168 hours. No results for input(s): AMMONIA in the last 168 hours.  CBC:  Recent Labs Lab 11/17/15 0101 11/17/15 0534  WBC 8.4 8.2  NEUTROABS 6.6*  --   HGB 11.4* 11.3*  HCT 35.9 35.7  MCV 89.1 87.8  PLT 175 170    Cardiac Enzymes: No results for input(s): CKTOTAL, CKMB, CKMBINDEX, TROPONINI  in the last 168 hours.  BNP: Invalid input(s): POCBNP  CBG:  Recent Labs Lab 11/17/15 0102  GLUCAP 93    Microbiology: Results for orders placed or performed during the hospital encounter of 06/19/15  MRSA PCR Screening     Status: None   Collection Time: 06/20/15 11:30 AM  Result Value Ref Range Status   MRSA by PCR NEGATIVE NEGATIVE Final    Comment:        The GeneXpert MRSA Assay (FDA approved for NASAL specimens only), is one component of a comprehensive MRSA colonization surveillance program. It is not intended to diagnose MRSA infection nor to guide or monitor treatment for MRSA infections.     Coagulation Studies:  Recent Labs  11/17/15 0101  LABPROT 14.5  INR 1.11    Urinalysis: No results for input(s): COLORURINE, LABSPEC, PHURINE, GLUCOSEU, HGBUR, BILIRUBINUR, KETONESUR, PROTEINUR, UROBILINOGEN, NITRITE, LEUKOCYTESUR in the last 72 hours.  Invalid input(s): APPERANCEUR    Imaging: Dg Chest Port 1 View  11/17/2015  CLINICAL DATA:  Acute onset of generalized chest pain. Initial encounter. EXAM: PORTABLE CHEST 1 VIEW COMPARISON:  Chest radiograph performed 06/19/2015 FINDINGS: The lungs are well-aerated. Mild left basilar opacity may reflect atelectasis or possibly mild pneumonia. Mild vascular congestion is noted. There is no evidence of pleural effusion or pneumothorax. The cardiomediastinal silhouette is within normal limits. A left-sided  dual-lumen catheter is noted ending about the cavoatrial junction. No acute osseous abnormalities are seen. IMPRESSION: Mild left basilar airspace opacity may reflect atelectasis or possibly mild pneumonia. Mild vascular congestion noted. Electronically Signed   By: Roanna RaiderJeffery  Chang M.D.   On: 11/17/2015 02:08   Dg Foot Complete Right  11/17/2015  CLINICAL DATA:  Discoloration at the right toes, acute onset. Initial encounter. EXAM: RIGHT FOOT COMPLETE - 3+ VIEW COMPARISON:  None. FINDINGS: There is no evidence of fracture  or dislocation. The joint spaces are preserved. There is no evidence of talar subluxation; the subtalar joint is unremarkable in appearance. Diffuse vascular calcifications are seen. IMPRESSION: 1. No evidence of fracture or dislocation. 2. Diffuse vascular calcifications seen. Electronically Signed   By: Roanna RaiderJeffery  Chang M.D.   On: 11/17/2015 02:09     Medications:   . heparin 700 Units/hr (11/17/15 0251)   . amLODipine  10 mg Oral Daily  . aspirin EC  81 mg Oral Daily  . clopidogrel  75 mg Oral Daily  . diazepam  5 mg Oral Once per day on Mon Wed Fri  . famotidine  20 mg Oral Daily  . feeding supplement (NEPRO CARB STEADY)  237 mL Oral Daily  . hydrALAZINE  25 mg Oral TID  . isosorbide mononitrate  120 mg Oral Daily  . lisinopril  20 mg Oral Daily  . metoprolol  50 mg Oral BID  . piperacillin-tazobactam (ZOSYN)  IV  3.375 g Intravenous Q12H  . polyethylene glycol  17 g Oral Daily  . risperiDONE  2 mg Oral Once per day on Mon Wed Fri  . sertraline  50 mg Oral Daily  . sevelamer carbonate  2,400 mg Oral TID WC  . sodium chloride  3 mL Intravenous Q12H  . sodium chloride  3 mL Intravenous Q12H  . [START ON 11/18/2015] vancomycin  500 mg Intravenous Q T,Th,Sa-HD  . vitamin B-12  1,000 mcg Oral Daily   sodium chloride, acetaminophen **OR** acetaminophen, HYDROcodone-acetaminophen, ondansetron **OR** ondansetron (ZOFRAN) IV, senna-docusate, sodium chloride  Assessment/ Plan:  Ms. Almeta MonasVivian F Sick is a 82 y.o. black  female with hypertension, diabetes mellitus type II, ESRD, RIGHT renal atrophy, anemia chronic kidney disease, and dementia  [facility] TTS [address]   1. End Stage Renal Disease with LIJ permcath: seen and examined on dialysis. Tolerating treatment well. UF of 0.5 litre - Monitor daily for dialysis need. Next treatment will be scheduled for Tuesday. Continue TTS schedule.  - outpatient dialysis adequacy is not usually at goal.   2. Anemia of chronic kidney disease:  hemoglobin 11.3 - holding epo  3. Secondary Hyperparathyroidism: Calcium at goal 9. Outpatient phosphorus elevated at 6.1. PTH at goal as outpatient.  - sevelamer  4. Hypertension: elevated today on treatment - home regimen of imdur, amlodipine, clonidine, metoprolol  5. Encephalopathy: unclear etiology. Not due to uremia. However concern for sepsis? On vanc and zosyn. Right foot gangrene and sacral decubitus ulcer. Blood cultures currently with no growth - order CT of head    LOS: 0 Makynzee Tigges 1/6/201711:32 AM

## 2015-11-18 LAB — BASIC METABOLIC PANEL
ANION GAP: 9 (ref 5–15)
BUN: 12 mg/dL (ref 6–20)
CALCIUM: 8.8 mg/dL — AB (ref 8.9–10.3)
CO2: 30 mmol/L (ref 22–32)
Chloride: 103 mmol/L (ref 101–111)
Creatinine, Ser: 2.31 mg/dL — ABNORMAL HIGH (ref 0.44–1.00)
GFR calc Af Amer: 20 mL/min — ABNORMAL LOW (ref >60)
GFR calc non Af Amer: 17 mL/min — ABNORMAL LOW (ref >60)
GLUCOSE: 82 mg/dL (ref 65–99)
Potassium: 3.6 mmol/L (ref 3.5–5.1)
Sodium: 142 mmol/L (ref 135–145)

## 2015-11-18 LAB — CBC
HEMATOCRIT: 32.8 % — AB (ref 35.0–47.0)
HEMOGLOBIN: 10.5 g/dL — AB (ref 12.0–16.0)
MCH: 29.1 pg (ref 26.0–34.0)
MCHC: 32 g/dL (ref 32.0–36.0)
MCV: 91 fL (ref 80.0–100.0)
PLATELETS: 149 10*3/uL — AB (ref 150–440)
RBC: 3.61 MIL/uL — AB (ref 3.80–5.20)
RDW: 16.2 % — ABNORMAL HIGH (ref 11.5–14.5)
WBC: 7.5 10*3/uL (ref 3.6–11.0)

## 2015-11-18 LAB — HEPARIN LEVEL (UNFRACTIONATED)
Heparin Unfractionated: 0.46 IU/mL (ref 0.30–0.70)
Heparin Unfractionated: 0.33 IU/mL (ref 0.30–0.70)

## 2015-11-18 MED ORDER — SEVELAMER CARBONATE 0.8 G PO PACK
2.4000 g | PACK | Freq: Three times a day (TID) | ORAL | Status: DC
  Administered 2015-11-19 (×2): 2.4 g via ORAL
  Filled 2015-11-18 (×5): qty 3

## 2015-11-18 NOTE — Progress Notes (Signed)
Nutrition Follow-up     INTERVENTION:   Meals and Snacks: Cater to patient preferences; if po intake remains poor, recommend liberalizing diet Medical Food Supplement Therapy: continue Nepro daily as ordered; recommend addition of Mighty Shakes TID and Magic Cup BID on meal trays.  Feeding Assistance: recommend assistance at meal times to promote po intake  NUTRITION DIAGNOSIS:   Inadequate oral intake related to acute illness as evidenced by meal completion < 25%.  GOAL:   Patient will meet greater than or equal to 90% of their needs  MONITOR:    (Energy Intake, Anthropometrics, Electrolyte/Renal Profile, Glucose Profile)  REASON FOR ASSESSMENT:   Malnutrition Screening Tool    ASSESSMENT:    Pt admitted with nonhealing right foot ulcer, possible osteomyelitis; ESRD on HD.Pt lethargic but arouseable to voice, disoriented   Past Medical History  Diagnosis Date  . HTN (hypertension)   . Diabetes mellitus, type II (HCC)   . Hearing loss   . Memory loss   . Chronic kidney disease (CKD), stage V (HCC)     GFR 2  . Dementia   . Peripheral vascular disease (HCC)      Diet Order:  Diet heart healthy/carb modified Room service appropriate?: Yes; Fluid consistency:: Thin   Energy Intake: recorded po intake 0% of meals  Food and Nutrition Related history: unable to assess  Electrolyte and Renal Profile:  Recent Labs Lab 11/17/15 0101 11/17/15 0534 11/18/15 0442  BUN 15 16 12   CREATININE 2.65* 2.72* 2.31*  NA 138 140 142  K 3.2* 3.4* 3.6   Glucose Profile:   Recent Labs  11/17/15 0102  GLUCAP 93   Meds: renvela  Nutrition Focused Physical Exam:  Unable to complete Nutrition-Focused physical exam at this time.    Height:   Ht Readings from Last 1 Encounters:  11/17/15 5\' 4"  (1.626 m)    Weight:   Wt Readings from Last 1 Encounters:  11/17/15 134 lb 7.7 oz (61 kg)    Filed Weights   11/17/15 1000  Weight: 134 lb 7.7 oz (61 kg)   Wt  Readings from Last 10 Encounters:  11/17/15 134 lb 7.7 oz (61 kg)  09/19/15 132 lb 4.4 oz (60 kg)  06/28/15 142 lb 8 oz (64.638 kg)  09/28/13 143 lb (64.864 kg)    BMI:  Body mass index is 23.07 kg/(m^2).  Estimated Nutritional Needs:   Kcal:  1525-1830 kcals  Protein:  73-92 g (1.2-1.5 g/kg)   Fluid:  1000 mL plus UOP  MODERATE Care Level  Providence Seward Medical CenterCate Keyshia Orwick MS, RD, LDN [phone] Pager  [phone] Weekend/On-Call Pager

## 2015-11-18 NOTE — Progress Notes (Signed)
ANTICOAGULATION CONSULT NOTE - follow up  Pharmacy Consult for heparin Indication: PAD  Allergies  Allergen Reactions  . Shellfish Allergy Other (See Comments)    Reaction:  Unknown     Patient Measurements: Height: 5\' 4"  (162.6 cm) Weight: 134 lb 7.7 oz (61 kg) IBW/kg (Calculated) : 54.7 Heparin Dosing Weight: 60 kg  Vital Signs: Temp: 97.5 F (36.4 C) (01/07 0019) Temp Source: Oral (01/07 0019) BP: 142/46 mmHg (01/07 0019) Pulse Rate: 66 (01/07 0019)  Labs:  Recent Labs  11/17/15 0101 11/17/15 0534 11/17/15 0949 11/17/15 1218 11/17/15 2216  HGB 11.4* 11.3*  --   --   --   HCT 35.9 35.7  --   --   --   PLT 175 170  --   --   --   APTT 39*  --   --   --   --   LABPROT 14.5  --   --   --   --   INR 1.11  --   --   --   --   HEPARINUNFRC  --   --  >3.60* 0.14* 0.46  CREATININE 2.65* 2.72*  --   --   --     Estimated Creatinine Clearance: 11.2 mL/min (by C-G formula based on Cr of 2.72).   Medical History: Past Medical History  Diagnosis Date  . HTN (hypertension)   . Diabetes mellitus, type II (HCC)   . Hearing loss   . Memory loss   . Chronic kidney disease (CKD), stage V (HCC)     GFR 2  . Dementia   . Peripheral vascular disease (HCC)     Medications:  Infusions:  . heparin 850 Units/hr (11/17/15 1619)    Assessment: 89 [facility] cc [facility] Some PAD with extremity discoloration per nursing, pharmacy consulted to dose heparin.   Goal of Therapy:  Heparin level 0.3-0.7 units/ml Monitor platelets by anticoagulation protocol: Yes   Plan:  Heparin level therapeutic. Continue current rate, will confirm level in 8 hours.   Carola FrostNathan A Jairo Bellew, Pharm.D., BCPS Clinical Pharmacist 11/18/2015,12:39 AM

## 2015-11-18 NOTE — Progress Notes (Signed)
[facility] [place] [facility]   PATIENT NAME: Caroline Flores    MR#:  [id number]  DATE OF BIRTH:  02/08/1920  SUBJECTIVE:   There is sleepy although awakens upon verbal commands. Granddaughter in the room. Patient trying to eat. REVIEW OF SYSTEMS:   Review of Systems  Unable to perform ROS: acuity of condition   Tolerating Diet: Some Tolerating PT: No  DRUG ALLERGIES:   Allergies  Allergen Reactions  . Shellfish Allergy Other (See Comments)    Reaction:  Unknown     VITALS:  Blood pressure 152/51, pulse 58, temperature 97.2 F (36.2 C), temperature source Axillary, resp. rate 17, height 5\' 4"  (1.626 m), weight 61 kg (134 lb 7.7 oz), SpO2 100 %.  PHYSICAL EXAMINATION:   Physical Exam  GENERAL:  82 y.o.-year-old patient lying in the bed with no acute distress.  EYES: Pupils equal, round, reactive to light and accommodation. No scleral icterus. Extraocular muscles intact.  HEENT: Head atraumatic, normocephalic. Oropharynx and nasopharynx clear.  NECK:  Supple, no jugular venous distention. No thyroid enlargement, no tenderness.  LUNGS: Normal breath sounds bilaterally, no wheezing, rales, rhonchi. No use of accessory muscles of respiration.  CARDIOVASCULAR: S1, S2 normal. No murmurs, rubs, or gallops.  ABDOMEN: Soft, nontender, nondistended. Bowel sounds present. No organomegaly or mass.  EXTREMITIES: No cyanosis, clubbing or edema b/l.  Patient has significant osteomyelitis but followed smelling left heel wound NEUROLOGIC: Cranial nerves II through XII are intact. No focal Motor or sensory deficits b/l.   PSYCHIATRIC: Sleepy SKIN: No obvious rash, lesion, or ulcer.    LABORATORY PANEL:   CBC  Recent Labs Lab 11/18/15 0442  WBC 7.5  HGB 10.5*  HCT 32.8*  PLT 149*    Chemistries   Recent Labs Lab 11/17/15 0101  11/18/15 0442  NA 138  < > 142  K 3.2*  < > 3.6  CL 101  < > 103  CO2 31  < > 30  GLUCOSE 113*  < > 82  BUN 15   < > 12  CREATININE 2.65*  < > 2.31*  CALCIUM 8.9  < > 8.8*  AST 18  --   --   ALT 14  --   --   ALKPHOS 77  --   --   BILITOT 0.8  --   --   < > = values in this interval not displayed.  Cardiac Enzymes No results for input(s): TROPONINI in the last 168 hours.  RADIOLOGY:  Ct Head Wo Contrast  11/17/2015  CLINICAL DATA:  Lethargic.  Encephalopathy. EXAM: CT HEAD WITHOUT CONTRAST TECHNIQUE: Contiguous axial images were obtained from the base of the skull through the vertex without intravenous contrast. COMPARISON:  CT scan of September 19, 2015. FINDINGS: Bony calvarium appears intact. Mild diffuse cortical atrophy is noted. Moderate chronic ischemic white matter disease is noted. No mass effect or midline shift is noted. Ventricular size is within normal limits. There is no evidence of mass lesion, hemorrhage or acute infarction. IMPRESSION: Mild diffuse cortical atrophy. Moderate chronic ischemic white matter disease. No acute intracranial abnormality seen. Electronically Signed   By: Lupita Raider, M.D.   On: 11/17/2015 15:27   Dg Chest Port 1 View  11/17/2015  CLINICAL DATA:  Acute onset of generalized chest pain. Initial encounter. EXAM: PORTABLE CHEST 1 VIEW COMPARISON:  Chest radiograph performed 06/19/2015 FINDINGS: The lungs are well-aerated. Mild left basilar opacity may reflect atelectasis or possibly mild pneumonia. Mild  vascular congestion is noted. There is no evidence of pleural effusion or pneumothorax. The cardiomediastinal silhouette is within normal limits. A left-sided dual-lumen catheter is noted ending about the cavoatrial junction. No acute osseous abnormalities are seen. IMPRESSION: Mild left basilar airspace opacity may reflect atelectasis or possibly mild pneumonia. Mild vascular congestion noted. Electronically Signed   By: Roanna RaiderJeffery  Chang M.D.   On: 11/17/2015 02:08   Dg Foot Complete Right  11/17/2015  CLINICAL DATA:  Discoloration at the right toes, acute onset. Initial  encounter. EXAM: RIGHT FOOT COMPLETE - 3+ VIEW COMPARISON:  None. FINDINGS: There is no evidence of fracture or dislocation. The joint spaces are preserved. There is no evidence of talar subluxation; the subtalar joint is unremarkable in appearance. Diffuse vascular calcifications are seen. IMPRESSION: 1. No evidence of fracture or dislocation. 2. Diffuse vascular calcifications seen. Electronically Signed   By: Roanna RaiderJeffery  Chang M.D.   On: 11/17/2015 02:09     ASSESSMENT AND PLAN:   82 year old female patient with history of end-stage renal disease on dialysis, hypertension, diabetes mellitus, dementia presented to the ER with right heel ulcer, blackish discoloration of the right foot and toes with poor circulation.  1. Nonhealing right foot ulcer with possible right foot osteomyelitis/ Gangrene right foot Consult placed to podiatry, doesn't think pt is a surgical candidate  Vascular surgery consult is placed on IV vancomycin and IV Zosyn antibiotic Anticoagulate patient with IV heparin drip Would consider Palliative care consult   2. Questionable arterial occlusion with poor circulation in the right foot With PVD - consult vascular surgery   3. ESRD on dialysis- f/u with nephrology   4.. Dementia Supportive care prn   5. HTN - continue metoprolol, lisinopril and hydralazine    Case discussed with Care Management/Social Worker. Management plans discussed with the patient, family and they are in agreement.  CODE STATUS: Full code  DVT Prophylaxis: Lovenox  TOTAL TIME TAKING CARE OF THIS PATIENT: 30 minutes.  >50% time spent on counselling and coordination of care   D/C  DEPENDING ON CLINICAL CONDITION.   Mariyanna Mucha M.D on 11/18/2015 at 3:16 PM  Between 7am to 6pm - Pager - [phone]  After 6pm go to [email or web address] - password [email or web address]  [facility]  Office  [phone]  CC: Primary care physician; Pcp Not In System

## 2015-11-18 NOTE — Progress Notes (Signed)
[facility]  ROUNDING NOTE   Subjective:   Hemodialysis yesterday. UF of 0.5 Tolerated treatment well CT head negative  Objective:  Vital signs in last 24 hours:  Temp:  [97.2 F (36.2 C)-97.9 F (36.6 C)] 97.9 F (36.6 C) (01/07 1524) Pulse Rate:  [50-70] 69 (01/07 1524) Resp:  [16-20] 17 (01/07 1524) BP: (136-170)/(40-59) 148/59 mmHg (01/07 1524) SpO2:  [100 %] 100 % (01/07 1524)  Weight change:  Filed Weights   11/17/15 1000  Weight: 61 kg (134 lb 7.7 oz)    Intake/Output: I/O last 3 completed shifts: In: 87 [IV Piggyback:50] Out: 500 [Other:500]   Intake/Output this shift:  Total I/O In: 240 [P.O.:240] Out: -   Physical Exam: General: NAD, laying in bed, somnulent  Head: Normocephalic, atraumatic. Moist oral mucosal membranes  Eyes: Anicteric, PERRL  Neck: Supple, trachea midline  Lungs:  Clear to auscultation  Heart: Regular rate and rhythm  Abdomen:  Soft, nontender  Extremities: no peripheral edema. Gangrenous changes to right foot  Neurologic: Somnulent, follows commands, alert to self only  Skin: +sacral decubitus  Access: LIJ permcath    Basic Metabolic Panel:  Recent Labs Lab 11/17/15 0101 11/17/15 0534 11/18/15 0442  NA 138 140 142  K 3.2* 3.4* 3.6  CL 101 100* 103  CO2 31 31 30   GLUCOSE 113* 117* 82  BUN 15 16 12   CREATININE 2.65* 2.72* 2.31*  CALCIUM 8.9 9.0 8.8*    Liver Function Tests:  Recent Labs Lab 11/17/15 0101  AST 18  ALT 14  ALKPHOS 77  BILITOT 0.8  PROT 6.3*  ALBUMIN 2.3*   No results for input(s): LIPASE, AMYLASE in the last 168 hours. No results for input(s): AMMONIA in the last 168 hours.  CBC:  Recent Labs Lab 11/17/15 0101 11/17/15 0534 11/18/15 0442  WBC 8.4 8.2 7.5  NEUTROABS 6.6*  --   --   HGB 11.4* 11.3* 10.5*  HCT 35.9 35.7 32.8*  MCV 89.1 87.8 91.0  PLT 175 170 149*    Cardiac Enzymes: No results for input(s): CKTOTAL, CKMB, CKMBINDEX, TROPONINI in the last 168  hours.  BNP: Invalid input(s): POCBNP  CBG:  Recent Labs Lab 11/17/15 0102  GLUCAP 93    Microbiology: Results for orders placed or performed during the hospital encounter of 11/17/15  Culture, blood (routine x 2)     Status: None (Preliminary result)   Collection Time: 11/17/15  1:27 AM  Result Value Ref Range Status   Specimen Description BLOOD RIGHT WRIST  Final   Special Requests BOTTLES DRAWN AEROBIC AND ANAEROBIC 7CC  Final   Culture NO GROWTH 1 DAY  Final   Report Status PENDING  Incomplete  Culture, blood (routine x 2)     Status: None (Preliminary result)   Collection Time: 11/17/15  2:30 AM  Result Value Ref Range Status   Specimen Description BLOOD RIGHT ANTECUBITAL  Final   Special Requests BOTTLES DRAWN AEROBIC AND ANAEROBIC  Final   Culture NO GROWTH 1 DAY  Final   Report Status PENDING  Incomplete    Coagulation Studies:  Recent Labs  11/17/15 0101  LABPROT 14.5  INR 1.11    Urinalysis: No results for input(s): COLORURINE, LABSPEC, PHURINE, GLUCOSEU, HGBUR, BILIRUBINUR, KETONESUR, PROTEINUR, UROBILINOGEN, NITRITE, LEUKOCYTESUR in the last 72 hours.  Invalid input(s): APPERANCEUR    Imaging: Ct Head Wo Contrast  11/17/2015  CLINICAL DATA:  Lethargic.  Encephalopathy. EXAM: CT HEAD WITHOUT CONTRAST TECHNIQUE: Contiguous axial images were  obtained from the base of the skull through the vertex without intravenous contrast. COMPARISON:  CT scan of September 19, 2015. FINDINGS: Bony calvarium appears intact. Mild diffuse cortical atrophy is noted. Moderate chronic ischemic white matter disease is noted. No mass effect or midline shift is noted. Ventricular size is within normal limits. There is no evidence of mass lesion, hemorrhage or acute infarction. IMPRESSION: Mild diffuse cortical atrophy. Moderate chronic ischemic white matter disease. No acute intracranial abnormality seen. Electronically Signed   By: Lupita Raider, M.D.   On: 11/17/2015 15:27    Dg Chest Port 1 View  11/17/2015  CLINICAL DATA:  Acute onset of generalized chest pain. Initial encounter. EXAM: PORTABLE CHEST 1 VIEW COMPARISON:  Chest radiograph performed 06/19/2015 FINDINGS: The lungs are well-aerated. Mild left basilar opacity may reflect atelectasis or possibly mild pneumonia. Mild vascular congestion is noted. There is no evidence of pleural effusion or pneumothorax. The cardiomediastinal silhouette is within normal limits. A left-sided dual-lumen catheter is noted ending about the cavoatrial junction. No acute osseous abnormalities are seen. IMPRESSION: Mild left basilar airspace opacity may reflect atelectasis or possibly mild pneumonia. Mild vascular congestion noted. Electronically Signed   By: Roanna Raider M.D.   On: 11/17/2015 02:08   Dg Foot Complete Right  11/17/2015  CLINICAL DATA:  Discoloration at the right toes, acute onset. Initial encounter. EXAM: RIGHT FOOT COMPLETE - 3+ VIEW COMPARISON:  None. FINDINGS: There is no evidence of fracture or dislocation. The joint spaces are preserved. There is no evidence of talar subluxation; the subtalar joint is unremarkable in appearance. Diffuse vascular calcifications are seen. IMPRESSION: 1. No evidence of fracture or dislocation. 2. Diffuse vascular calcifications seen. Electronically Signed   By: Roanna Raider M.D.   On: 11/17/2015 02:09     Medications:   . heparin 850 Units/hr (11/18/15 0121)   . amLODipine  10 mg Oral Daily  . aspirin EC  81 mg Oral Daily  . clopidogrel  75 mg Oral Daily  . diazepam  5 mg Oral Q T,Th,Sa-HD  . famotidine  20 mg Oral Daily  . feeding supplement (NEPRO CARB STEADY)  237 mL Oral Daily  . hydrALAZINE  25 mg Oral TID  . isosorbide mononitrate  120 mg Oral Daily  . lisinopril  20 mg Oral Daily  . metoprolol  50 mg Oral BID  . piperacillin-tazobactam (ZOSYN)  IV  3.375 g Intravenous Q12H  . polyethylene glycol  17 g Oral Daily  . risperiDONE  2 mg Oral Q T,Th,Sa-HD  .  sertraline  50 mg Oral Daily  . sevelamer carbonate  2.4 g Oral TID WC  . sodium chloride  3 mL Intravenous Q12H  . sodium chloride  3 mL Intravenous Q12H  . [START ON 11/21/2015] vancomycin  500 mg Intravenous Q T,Th,Sa-HD  . vitamin B-12  1,000 mcg Oral Daily   sodium chloride, acetaminophen **OR** acetaminophen, HYDROcodone-acetaminophen, ondansetron **OR** ondansetron (ZOFRAN) IV, senna-docusate, sodium chloride  Assessment/ Plan:  Caroline Flores is a 82 y.o. black  female with hypertension, diabetes mellitus type II, ESRD, RIGHT renal atrophy, anemia chronic kidney disease, and dementia  [facility] TTS [address]   1. End Stage Renal Disease with LIJ permcath: tolerated treatment well yesterday - Monitor daily for dialysis need. Next treatment will be scheduled for Tuesday. Continue TTS schedule.  - outpatient dialysis adequacy is not usually at goal.   2. Anemia of chronic kidney disease: hemoglobin 10.5 - holding epo  3. Secondary Hyperparathyroidism: Calcium at goal 8.8. Outpatient phosphorus elevated at 6.1. PTH at goal as outpatient.  - sevelamer with meals  4. Hypertension: well controlled blood pressure - home regimen of imdur, hydralazine, amlodipine, clonidine, metoprolol - currently off her clonidine   LOS: 1 Caroline Flores 1/7/20175:18 PM

## 2015-11-18 NOTE — Progress Notes (Signed)
ANTICOAGULATION CONSULT NOTE - follow up  Pharmacy Consult for heparin Indication: PAD  Allergies  Allergen Reactions  . Shellfish Allergy Other (See Comments)    Reaction:  Unknown     Patient Measurements: Height: 5\' 4"  (162.6 cm) Weight: 134 lb 7.7 oz (61 kg) IBW/kg (Calculated) : 54.7 Heparin Dosing Weight: 60 kg  Vital Signs: Temp: 97.4 F (36.3 C) (01/07 0847) Temp Source: Axillary (01/07 0847) BP: 170/51 mmHg (01/07 0847) Pulse Rate: 53 (01/07 0847)  Labs:  Recent Labs  11/17/15 0101 11/17/15 0534  11/17/15 1218 11/17/15 2216 11/18/15 0442 11/18/15 0808  HGB 11.4* 11.3*  --   --   --  10.5*  --   HCT 35.9 35.7  --   --   --  32.8*  --   PLT 175 170  --   --   --  149*  --   APTT 39*  --   --   --   --   --   --   LABPROT 14.5  --   --   --   --   --   --   INR 1.11  --   --   --   --   --   --   HEPARINUNFRC  --   --   < > 0.14* 0.46  --  0.33  CREATININE 2.65* 2.72*  --   --   --  2.31*  --   < > = values in this interval not displayed.  Estimated Creatinine Clearance: 13.1 mL/min (by C-G formula based on Cr of 2.31).   Medical History: Past Medical History  Diagnosis Date  . HTN (hypertension)   . Diabetes mellitus, type II (HCC)   . Hearing loss   . Memory loss   . Chronic kidney disease (CKD), stage V (HCC)     GFR 2  . Dementia   . Peripheral vascular disease (HCC)     Medications:  Infusions:  . heparin 850 Units/hr (11/18/15 0121)    Assessment: 89 yof cc [facility]. Some PAD with extremity discoloration per nursing, pharmacy consulted to dose heparin.   Goal of Therapy:  Heparin level 0.3-0.7 units/ml Monitor platelets by anticoagulation protocol: Yes   Plan:  Heparin level therapeutic. Will continue current rate of heparin gtt. Will order Heparin level and CBC  with am labs   Tula Schryver D, Pharm.D., BCPS Clinical Pharmacist 11/18/2015,9:29 AM

## 2015-11-18 NOTE — Plan of Care (Signed)
Problem: Safety: Goal: Ability to remain free from injury will improve Outcome: Completed/Met Date Met:  11/18/15 Pt free from falls this shift.  Problem: Health Behavior/Discharge Planning: Goal: Ability to manage health-related needs will improve Outcome: Not Progressing Not able to manage health at this time.  Problem: Pain Managment: Goal: General experience of comfort will improve Outcome: Progressing No complaints of pain this shift.  Problem: Skin Integrity: Goal: Risk for impaired skin integrity will decrease Outcome: Progressing Pt turned q2h.

## 2015-11-19 LAB — BLOOD CULTURE ID PANEL (REFLEXED)
Acinetobacter baumannii: NOT DETECTED
CANDIDA ALBICANS: NOT DETECTED
CANDIDA PARAPSILOSIS: NOT DETECTED
Candida glabrata: NOT DETECTED
Candida krusei: NOT DETECTED
Candida tropicalis: NOT DETECTED
Carbapenem resistance: NOT DETECTED
ENTEROBACTERIACEAE SPECIES: NOT DETECTED
Enterobacter cloacae complex: NOT DETECTED
Enterococcus species: NOT DETECTED
Escherichia coli: NOT DETECTED
HAEMOPHILUS INFLUENZAE: NOT DETECTED
KLEBSIELLA OXYTOCA: NOT DETECTED
Klebsiella pneumoniae: NOT DETECTED
LISTERIA MONOCYTOGENES: NOT DETECTED
METHICILLIN RESISTANCE: DETECTED — AB
Neisseria meningitidis: NOT DETECTED
PSEUDOMONAS AERUGINOSA: NOT DETECTED
Proteus species: NOT DETECTED
STAPHYLOCOCCUS SPECIES: DETECTED — AB
STREPTOCOCCUS AGALACTIAE: NOT DETECTED
STREPTOCOCCUS SPECIES: NOT DETECTED
Serratia marcescens: NOT DETECTED
Staphylococcus aureus (BCID): NOT DETECTED
Streptococcus pneumoniae: NOT DETECTED
Streptococcus pyogenes: NOT DETECTED
VANCOMYCIN RESISTANCE: NOT DETECTED

## 2015-11-19 LAB — CBC
HCT: 31.3 % — ABNORMAL LOW (ref 35.0–47.0)
Hemoglobin: 9.8 g/dL — ABNORMAL LOW (ref 12.0–16.0)
MCH: 28.7 pg (ref 26.0–34.0)
MCHC: 31.5 g/dL — ABNORMAL LOW (ref 32.0–36.0)
MCV: 91.3 fL (ref 80.0–100.0)
Platelets: 159 K/uL (ref 150–440)
RBC: 3.42 MIL/uL — ABNORMAL LOW (ref 3.80–5.20)
RDW: 16.2 % — ABNORMAL HIGH (ref 11.5–14.5)
WBC: 7.1 K/uL (ref 3.6–11.0)

## 2015-11-19 LAB — HEPARIN LEVEL (UNFRACTIONATED)
Heparin Unfractionated: 0.23 [IU]/mL — ABNORMAL LOW (ref 0.30–0.70)
Heparin Unfractionated: 0.44 [IU]/mL (ref 0.30–0.70)

## 2015-11-19 MED ORDER — MORPHINE SULFATE (PF) 2 MG/ML IV SOLN
1.0000 mg | INTRAVENOUS | Status: DC
  Filled 2015-11-19: qty 1

## 2015-11-19 MED ORDER — HEPARIN BOLUS VIA INFUSION
900.0000 [IU] | Freq: Once | INTRAVENOUS | Status: AC
  Administered 2015-11-19: 900 [IU] via INTRAVENOUS
  Filled 2015-11-19: qty 900

## 2015-11-19 NOTE — Progress Notes (Signed)
Pharmacy Antibiotic Follow-up Note  Almeta MonasVivian F Pitney is a 82 y.o. year-old female admitted on 11/17/2015.  The patient is currently on day 2 of vancomycin and Zosyn for PAD/bacteremia.  Assessment/Plan: This patient's current antibiotics will be continued without adjustments.  Temp (24hrs), Avg:97.8 F (36.6 C), Min:97.2 F (36.2 C), Max:98.4 F (36.9 C)   Recent Labs Lab 11/17/15 0101 11/17/15 0534 11/18/15 0442  WBC 8.4 8.2 7.5    Recent Labs Lab 11/17/15 0101 11/17/15 0534 11/18/15 0442  CREATININE 2.65* 2.72* 2.31*   Estimated Creatinine Clearance: 13.1 mL/min (by C-G formula based on Cr of 2.31).    Allergies  Allergen Reactions  . Shellfish Allergy Other (See Comments)    Reaction:  Unknown    Thank you for allowing pharmacy to be a part of this patient's care.  Carola FrostNathan A Ronette Hank, Pharm.D., BCPS Clinical Pharmacist  11/19/2015 2:31 AM

## 2015-11-19 NOTE — Progress Notes (Signed)
Poor Po intake. Takes meds crushed in apple sauce. Heparin gtt at 10. Sleepy. Room air.No pain. Family was updated of care. Pt has no further concerns at this time.

## 2015-11-19 NOTE — Progress Notes (Signed)
ANTICOAGULATION CONSULT NOTE - follow up  Pharmacy Consult for heparin Indication: PAD  Allergies  Allergen Reactions  . Shellfish Allergy Other (See Comments)    Reaction:  Unknown     Patient Measurements: Height: 5\' 4"  (162.6 cm) Weight: 134 lb 7.7 oz (61 kg) IBW/kg (Calculated) : 54.7 Heparin Dosing Weight: 60 kg  Vital Signs: Temp: 97.7 F (36.5 C) (01/08 0350) Temp Source: Oral (01/08 0350) BP: 142/46 mmHg (01/08 0350) Pulse Rate: 65 (01/08 0350)  Labs:  Recent Labs  11/17/15 0101 11/17/15 0534  11/17/15 2216 11/18/15 0442 11/18/15 0808 11/19/15 0500  HGB 11.4* 11.3*  --   --  10.5*  --  9.8*  HCT 35.9 35.7  --   --  32.8*  --  31.3*  PLT 175 170  --   --  149*  --  159  APTT 39*  --   --   --   --   --   --   LABPROT 14.5  --   --   --   --   --   --   INR 1.11  --   --   --   --   --   --   HEPARINUNFRC  --   --   < > 0.46  --  0.33 0.23*  CREATININE 2.65* 2.72*  --   --  2.31*  --   --   < > = values in this interval not displayed.  Estimated Creatinine Clearance: 13.1 mL/min (by C-G formula based on Cr of 2.31).   Medical History: Past Medical History  Diagnosis Date  . HTN (hypertension)   . Diabetes mellitus, type II (HCC)   . Hearing loss   . Memory loss   . Chronic kidney disease (CKD), stage V (HCC)     GFR 2  . Dementia   . Peripheral vascular disease (HCC)     Medications:  Infusions:  . heparin 850 Units/hr (11/19/15 0500)    Assessment: 89 yof cc full [facility]. Some PAD with extremity discoloration per nursing, pharmacy consulted to dose heparin.   Goal of Therapy:  Heparin level 0.3-0.7 units/ml Monitor platelets by anticoagulation protocol: Yes   Plan:  Heparin level subtherapeutic. Ordered 900 unit IV bolus x 1 and increase rate to 1000 units/hr. Will order follow up level in 8 hours.  Carola FrostNathan A Wallis Spizzirri, Pharm.D., BCPS Clinical Pharmacist 11/19/2015,7:07 AM

## 2015-11-19 NOTE — Progress Notes (Signed)
[facility]  ROUNDING NOTE   Subjective:   Blood cultures with MRSA  Objective:  Vital signs in last 24 hours:  Temp:  [97.7 F (36.5 C)-98.7 F (37.1 C)] 98.7 F (37.1 C) (01/08 1158) Pulse Rate:  [59-70] 66 (01/08 1158) Resp:  [17-18] 17 (01/08 1158) BP: (101-162)/(35-68) 162/68 mmHg (01/08 1158) SpO2:  [100 %] 100 % (01/08 1158)  Weight change:  Filed Weights   11/17/15 1000  Weight: 61 kg (134 lb 7.7 oz)    Intake/Output: I/O last 3 completed shifts: In: 440 [P.O.:240; IV Piggyback:200] Out: -    Intake/Output this shift:     Physical Exam: General: NAD, laying in bed, somnulent  Head: Normocephalic, atraumatic. Moist oral mucosal membranes  Eyes: Anicteric, PERRL  Neck: Supple, trachea midline  Lungs:  Clear to auscultation  Heart: Regular rate and rhythm  Abdomen:  Soft, nontender  Extremities: no peripheral edema. Gangrenous changes to right foot  Neurologic: Somnulent, follows commands, alert to self only  Skin: +sacral decubitus  Access: LIJ permcath    Basic Metabolic Panel:  Recent Labs Lab 11/17/15 0101 11/17/15 0534 11/18/15 0442  NA 138 140 142  K 3.2* 3.4* 3.6  CL 101 100* 103  CO2 31 31 30   GLUCOSE 113* 117* 82  BUN 15 16 12   CREATININE 2.65* 2.72* 2.31*  CALCIUM 8.9 9.0 8.8*    Liver Function Tests:  Recent Labs Lab 11/17/15 0101  AST 18  ALT 14  ALKPHOS 77  BILITOT 0.8  PROT 6.3*  ALBUMIN 2.3*   No results for input(s): LIPASE, AMYLASE in the last 168 hours. No results for input(s): AMMONIA in the last 168 hours.  CBC:  Recent Labs Lab 11/17/15 0101 11/17/15 0534 11/18/15 0442 11/19/15 0500  WBC 8.4 8.2 7.5 7.1  NEUTROABS 6.6*  --   --   --   HGB 11.4* 11.3* 10.5* 9.8*  HCT 35.9 35.7 32.8* 31.3*  MCV 89.1 87.8 91.0 91.3  PLT 175 170 149* 159    Cardiac Enzymes: No results for input(s): CKTOTAL, CKMB, CKMBINDEX, TROPONINI in the last 168 hours.  BNP: Invalid input(s):  POCBNP  CBG:  Recent Labs Lab 11/17/15 0102  GLUCAP 93    Microbiology: Results for orders placed or performed during the hospital encounter of 11/17/15  Culture, blood (routine x 2)     Status: None (Preliminary result)   Collection Time: 11/17/15  1:27 AM  Result Value Ref Range Status   Specimen Description BLOOD RIGHT WRIST  Final   Special Requests BOTTLES DRAWN AEROBIC AND ANAEROBIC 7CC  Final   Culture NO GROWTH 2 DAYS  Final   Report Status PENDING  Incomplete  Culture, blood (routine x 2)     Status: None (Preliminary result)   Collection Time: 11/17/15  2:30 AM  Result Value Ref Range Status   Specimen Description BLOOD RIGHT ANTECUBITAL  Final   Special Requests BOTTLES DRAWN AEROBIC AND ANAEROBIC  Final   Culture  Setup Time   Final    GRAM POSITIVE COCCI AEROBIC BOTTLE ONLY CRITICAL RESULT CALLED TO, READ BACK BY AND VERIFIED WITH: NATE COOKSON AT 0228 11/19/15.PMH CONFIRMED BY [facility] Organism ID to follow    Culture GRAM POSITIVE COCCI AEROBIC BOTTLE ONLY   Final   Report Status PENDING  Incomplete  Blood Culture ID Panel (Reflexed)     Status: Abnormal   Collection Time: 11/17/15  2:30 AM  Result Value Ref Range Status   Enterococcus species  NOT DETECTED NOT DETECTED Final   Listeria monocytogenes NOT DETECTED NOT DETECTED Final   Staphylococcus species DETECTED (A) NOT DETECTED Final    Comment: CRITICAL RESULT CALLED TO, READ BACK BY AND VERIFIED WITH: NATE COOKSON AT 0228 11/19/15.PMH    Staphylococcus aureus NOT DETECTED NOT DETECTED Final   Streptococcus species NOT DETECTED NOT DETECTED Final   Streptococcus agalactiae NOT DETECTED NOT DETECTED Final   Streptococcus pneumoniae NOT DETECTED NOT DETECTED Final   Streptococcus pyogenes NOT DETECTED NOT DETECTED Final   Acinetobacter baumannii NOT DETECTED NOT DETECTED Final   Enterobacteriaceae species NOT DETECTED NOT DETECTED Final   Enterobacter cloacae complex NOT DETECTED NOT DETECTED Final    Escherichia coli NOT DETECTED NOT DETECTED Final   Klebsiella oxytoca NOT DETECTED NOT DETECTED Final   Klebsiella pneumoniae NOT DETECTED NOT DETECTED Final   Proteus species NOT DETECTED NOT DETECTED Final   Serratia marcescens NOT DETECTED NOT DETECTED Final   Haemophilus influenzae NOT DETECTED NOT DETECTED Final   Neisseria meningitidis NOT DETECTED NOT DETECTED Final   Pseudomonas aeruginosa NOT DETECTED NOT DETECTED Final   Candida albicans NOT DETECTED NOT DETECTED Final   Candida glabrata NOT DETECTED NOT DETECTED Final   Candida krusei NOT DETECTED NOT DETECTED Final   Candida parapsilosis NOT DETECTED NOT DETECTED Final   Candida tropicalis NOT DETECTED NOT DETECTED Final   Carbapenem resistance NOT DETECTED NOT DETECTED Final   Methicillin resistance DETECTED (A) NOT DETECTED Final    Comment: CRITICAL RESULT CALLED TO, READ BACK BY AND VERIFIED WITH: NATE COOKSON AT 0228 11/19/15.PMH    Vancomycin resistance NOT DETECTED NOT DETECTED Final    Coagulation Studies:  Recent Labs  11/17/15 0101  LABPROT 14.5  INR 1.11    Urinalysis: No results for input(s): COLORURINE, LABSPEC, PHURINE, GLUCOSEU, HGBUR, BILIRUBINUR, KETONESUR, PROTEINUR, UROBILINOGEN, NITRITE, LEUKOCYTESUR in the last 72 hours.  Invalid input(s): APPERANCEUR    Imaging: Ct Head Wo Contrast  11/17/2015  CLINICAL DATA:  Lethargic.  Encephalopathy. EXAM: CT HEAD WITHOUT CONTRAST TECHNIQUE: Contiguous axial images were obtained from the base of the skull through the vertex without intravenous contrast. COMPARISON:  CT scan of September 19, 2015. FINDINGS: Bony calvarium appears intact. Mild diffuse cortical atrophy is noted. Moderate chronic ischemic white matter disease is noted. No mass effect or midline shift is noted. Ventricular size is within normal limits. There is no evidence of mass lesion, hemorrhage or acute infarction. IMPRESSION: Mild diffuse cortical atrophy. Moderate chronic ischemic white matter  disease. No acute intracranial abnormality seen. Electronically Signed   By: Lupita RaiderJames  Green Jr, M.D.   On: 11/17/2015 15:27     Medications:   . heparin 1,000 Units/hr (11/19/15 0907)   . amLODipine  10 mg Oral Daily  . aspirin EC  81 mg Oral Daily  . clopidogrel  75 mg Oral Daily  . diazepam  5 mg Oral Q T,Th,Sa-HD  . famotidine  20 mg Oral Daily  . feeding supplement (NEPRO CARB STEADY)  237 mL Oral Daily  . hydrALAZINE  25 mg Oral TID  . isosorbide mononitrate  120 mg Oral Daily  . lisinopril  20 mg Oral Daily  . metoprolol  50 mg Oral BID  . piperacillin-tazobactam (ZOSYN)  IV  3.375 g Intravenous Q12H  . polyethylene glycol  17 g Oral Daily  . risperiDONE  2 mg Oral Q T,Th,Sa-HD  . sertraline  50 mg Oral Daily  . sevelamer carbonate  2.4 g Oral TID WC  .  sodium chloride  3 mL Intravenous Q12H  . sodium chloride  3 mL Intravenous Q12H  . [START ON 11/21/2015] vancomycin  500 mg Intravenous Q T,Th,Sa-HD  . vitamin B-12  1,000 mcg Oral Daily   sodium chloride, acetaminophen **OR** acetaminophen, HYDROcodone-acetaminophen, ondansetron **OR** ondansetron (ZOFRAN) IV, senna-docusate, sodium chloride  Assessment/ Plan:  Ms. Caroline Flores is a 82 y.o. black  female with hypertension, diabetes mellitus type II, ESRD, RIGHT renal atrophy, anemia chronic kidney disease, and dementia  [facility] TTS [address]   1. End Stage Renal Disease with LIJ permcath: last treatment was Friday.  - Monitor daily for dialysis need. Next treatment will be scheduled for Tuesday. Continue TTS schedule.  - outpatient dialysis adequacy is not usually at goal.   2. Anemia of chronic kidney disease: hemoglobin 9.8 - epo with treatment  3. Secondary Hyperparathyroidism: Calcium at goal 8.8. Outpatient phosphorus elevated at 6.1. PTH at goal as outpatient.  - sevelamer with meals  4. Hypertension: well controlled blood pressure - home regimen of imdur, hydralazine, amlodipine, clonidine,  metoprolol - currently off her clonidine   LOS: 2 Berlene Dixson 1/8/20172:13 PM

## 2015-11-19 NOTE — Progress Notes (Signed)
ANTICOAGULATION CONSULT NOTE - follow up  Pharmacy Consult for heparin Indication: PAD  Allergies  Allergen Reactions  . Shellfish Allergy Other (See Comments)    Reaction:  Unknown     Patient Measurements: Height: 5\' 4"  (162.6 cm) Weight: 134 lb 7.7 oz (61 kg) IBW/kg (Calculated) : 54.7 Heparin Dosing Weight: 60 kg  Vital Signs: Temp: 98.7 F (37.1 C) (01/08 1529) Temp Source: Axillary (01/08 1529) BP: 160/51 mmHg (01/08 1529) Pulse Rate: 61 (01/08 1529)  Labs:  Recent Labs  11/17/15 0101 11/17/15 0534  11/18/15 0442 11/18/15 0808 11/19/15 0500 11/19/15 1611  HGB 11.4* 11.3*  --  10.5*  --  9.8*  --   HCT 35.9 35.7  --  32.8*  --  31.3*  --   PLT 175 170  --  149*  --  159  --   APTT 39*  --   --   --   --   --   --   LABPROT 14.5  --   --   --   --   --   --   INR 1.11  --   --   --   --   --   --   HEPARINUNFRC  --   --   < >  --  0.33 0.23* 0.44  CREATININE 2.65* 2.72*  --  2.31*  --   --   --   < > = values in this interval not displayed.  Estimated Creatinine Clearance: 13.1 mL/min (by C-G formula based on Cr of 2.31).   Medical History: Past Medical History  Diagnosis Date  . HTN (hypertension)   . Diabetes mellitus, type II (HCC)   . Hearing loss   . Memory loss   . Chronic kidney disease (CKD), stage V (HCC)     GFR 2  . Dementia   . Peripheral vascular disease (HCC)     Medications:  Infusions:  . heparin 1,000 Units/hr (11/19/15 [id number])    Assessment: 89 y[facility] cc [facility]. Some PAD with extremity discoloration per nursing, pharmacy consulted to dose heparin.   Heparin level resulted at 0.44.  Goal of Therapy:  Heparin level 0.3-0.7 units/ml Monitor platelets by anticoagulation protocol: Yes   Plan:  Heparin level within range, will check confirmation heparin level in 8hr.  Foye DeerLisa G Aleyza Salmi, Pharm.D., BCPS Clinical Pharmacist 11/19/2015,6:44 PM

## 2015-11-19 NOTE — Progress Notes (Signed)
[facility] [place] [facility]   PATIENT NAME: Caroline Flores    MR#:  [id number]  DATE OF BIRTH:  [birth year]  SUBJECTIVE:   pt is sleepy although awakens upon verbal commands.  REVIEW OF SYSTEMS:   Review of Systems  Unable to perform ROS: acuity of condition   Tolerating Diet: Some Tolerating PT: No  DRUG ALLERGIES:   Allergies  Allergen Reactions  . Shellfish Allergy Other (See Comments)    Reaction:  Unknown     VITALS:  Blood pressure 162/68, pulse 66, temperature 98.7 F (37.1 C), temperature source Axillary, resp. rate 17, height 5\' 4"  (1.626 m), weight 61 kg (134 lb 7.7 oz), SpO2 100 %.  PHYSICAL EXAMINATION:   Physical Exam  GENERAL:  82 y.o.-year-old patient lying in the bed with no acute distress.  EYES: Pupils equal, round, reactive to light and accommodation. No scleral icterus. Extraocular muscles intact.  HEENT: Head atraumatic, normocephalic. Oropharynx and nasopharynx clear.  NECK:  Supple, no jugular venous distention. No thyroid enlargement, no tenderness.  LUNGS: Normal breath sounds bilaterally, no wheezing, rales, rhonchi. No use of accessory muscles of respiration.  CARDIOVASCULAR: S1, S2 normal. No murmurs, rubs, or gallops.  ABDOMEN: Soft, nontender, nondistended. Bowel sounds present. No organomegaly or mass.  EXTREMITIES: No cyanosis, clubbing or edema b/l.  Patient has significant foul smelling from left heel wound NEUROLOGIC:unable to assess PSYCHIATRIC: Sleepy  SKIN: No obvious rash, lesion, or ulcer.   LABORATORY PANEL:   CBC  Recent Labs Lab 11/19/15 0500  WBC 7.1  HGB 9.8*  HCT 31.3*  PLT 159    Chemistries   Recent Labs Lab 11/17/15 0101  11/18/15 0442  NA 138  < > 142  K 3.2*  < > 3.6  CL 101  < > 103  CO2 31  < > 30  GLUCOSE 113*  < > 82  BUN 15  < > 12  CREATININE 2.65*  < > 2.31*  CALCIUM 8.9  < > 8.8*  AST 18  --   --   ALT 14  --   --   ALKPHOS 77  --   --   BILITOT 0.8   --   --   < > = values in this interval not displayed.  Cardiac Enzymes No results for input(s): TROPONINI in the last 168 hours.  RADIOLOGY:  Ct Head Wo Contrast  11/17/2015  CLINICAL DATA:  Lethargic.  Encephalopathy. EXAM: CT HEAD WITHOUT CONTRAST TECHNIQUE: Contiguous axial images were obtained from the base of the skull through the vertex without intravenous contrast. COMPARISON:  CT scan of September 19, 2015. FINDINGS: Bony calvarium appears intact. Mild diffuse cortical atrophy is noted. Moderate chronic ischemic white matter disease is noted. No mass effect or midline shift is noted. Ventricular size is within normal limits. There is no evidence of mass lesion, hemorrhage or acute infarction. IMPRESSION: Mild diffuse cortical atrophy. Moderate chronic ischemic white matter disease. No acute intracranial abnormality seen. Electronically Signed   By: Lupita RaiderJames  Green Jr, M.D.   On: 11/17/2015 15:27     ASSESSMENT AND PLAN:   82 year old female patient with history of end-stage renal disease on dialysis, hypertension, diabetes mellitus, dementia presented to the ER with right heel ulcer, blackish discoloration of the right foot and toes with poor circulation.  1. Nonhealing right foot ulcer with possible right foot osteomyelitis/ Gangrene right foot Consult with podiatry noted and pt is not  A good  surgical candidate.  May require BKA Vascular surgery consult is placed.  on IV vancomycin and IV Zosyn antibiotic Anticoagulate patient with IV heparin drip Would consider Palliative care consult   2. Questionable arterial occlusion with poor circulation in the right foot With PVD - consult vascular surgery   3. ESRD on dialysis- f/u with nephrology   4.. Dementia Supportive care prn   5. HTN - continue metoprolol, lisinopril and hydralazine   Left message for leslie foster [phone] Case discussed with Care Management/Social Worker. Management plans discussed with the patient,  family and they are in agreement.  CODE STATUS: Full code  DVT Prophylaxis: Lovenox  TOTAL TIME TAKING CARE OF THIS PATIENT: 25 minutes.  >50% time spent on counselling and coordination of care   D/C  DEPENDING ON CLINICAL CONDITION.   Shonica Weier M.D on 11/19/2015 at 12:01 PM  Between 7am to 6pm - Pager - [phone]  After 6pm go to [email or web address] - password [email or web address]  [facility]  Office  [phone]  CC: Primary care physician; Pcp Not In System

## 2015-11-19 NOTE — Consult Note (Signed)
[facility] Vascular Consult Note  MRN : [id number]  Caroline Flores is a 82 y.o. (08/04/1920) female who presents with chief complaint of  Chief Complaint  Patient presents with  . Circulatory Problem   History of Present Illness:  Patient with dementia. Poor historian. Unable to answer questions appropriately during examination. Information for consult obtained from H&P, Consults and Progress Notes.  Patient from [facility] nursing home seen in [facility] ER with a chief complaint of "not taking her medicines and discoloration to her toes". She has a compensated medical history including diabetes, hypertension, renal failure on hemodialysis Tuesday, Thursday, Saturday. Patient has a chronic right diabetic heel ulcer which has been treated at the nursing facility. Nursing staff notes discoloration to patient's toes, unknown onset. Family input from notes states grand-niece informs the area of concern has gradually increased in size.  The patient was admitted and started on IV heparin drip and IV antibiotics. In setting of renal failure, CT angiogram of the right lower extremity could not be done.  Patient seen by general surgery in consult: No indication for general surgery intervention at this time.  Patient seen by podiatry in consult: Not a good candidate for a surgical approach. Podiatry spoke with her power of attorney. Podiatry feels she has multiple medical problems and issues and is not a very good candidate for any type of surgical approach. As per podiatry, her power of attorney understands this. Podiatry informs that the doctor at [facility] have spoken to the power of attorney regarding palliative care.  Vascular surgery was consulted by podiatry (Dr. Orland Jarred) for further recommendation in regard to amputation.  Current Facility-Administered Medications  Medication Dose Route Frequency Provider Last Rate Last Dose  . 0.9 %  sodium chloride infusion   250 mL Intravenous PRN Ihor Austin, MD      . acetaminophen (TYLENOL) tablet 650 mg  650 mg Oral Q6H PRN Ihor Austin, MD       Or  . acetaminophen (TYLENOL) suppository 650 mg  650 mg Rectal Q6H PRN Pavan Pyreddy, MD      . amLODipine (NORVASC) tablet 10 mg  10 mg Oral Daily Ihor Austin, MD   10 mg at 11/19/15 4098  . aspirin EC tablet 81 mg  81 mg Oral Daily Ihor Austin, MD   81 mg at 11/19/15 1191  . clopidogrel (PLAVIX) tablet 75 mg  75 mg Oral Daily Ihor Austin, MD   75 mg at 11/19/15 0823  . diazepam (VALIUM) tablet 5 mg  5 mg Oral Q T,Th,Sa-HD Ihor Austin, MD   5 mg at 11/18/15 1406  . famotidine (PEPCID) tablet 20 mg  20 mg Oral Daily Ihor Austin, MD   20 mg at 11/19/15 0823  . feeding supplement (NEPRO CARB STEADY) liquid 237 mL  237 mL Oral Daily Pavan Pyreddy, MD   237 mL at 11/19/15 1000  . heparin ADULT infusion 100 units/mL (25000 units/250 mL)  1,000 Units/hr Intravenous Continuous Enedina Finner, MD 10 mL/hr at 11/19/15 0907 1,000 Units/hr at 11/19/15 0907  . hydrALAZINE (APRESOLINE) tablet 25 mg  25 mg Oral TID Ihor Austin, MD   25 mg at 11/19/15 4782  . HYDROcodone-acetaminophen (NORCO/VICODIN) 5-325 MG per tablet 1-2 tablet  1-2 tablet Oral Q4H PRN Ihor Austin, MD   1 tablet at 11/17/15 1404  . isosorbide mononitrate (IMDUR) 24 hr tablet 120 mg  120 mg Oral Daily Ihor Austin, MD   120 mg at 11/19/15  9147  . lisinopril (PRINIVIL,ZESTRIL) tablet 20 mg  20 mg Oral Daily Ihor Austin, MD   20 mg at 11/19/15 0823  . metoprolol (LOPRESSOR) tablet 50 mg  50 mg Oral BID Ihor Austin, MD   50 mg at 11/19/15 8295  . ondansetron (ZOFRAN) tablet 4 mg  4 mg Oral Q6H PRN Ihor Austin, MD       Or  . ondansetron (ZOFRAN) injection 4 mg  4 mg Intravenous Q6H PRN Pavan Pyreddy, MD      . piperacillin-tazobactam (ZOSYN) IVPB 3.375 g  3.375 g Intravenous Q12H Ihor Austin, MD   3.375 g at 11/19/15 0223  . polyethylene glycol (MIRALAX / GLYCOLAX) packet 17 g  17 g Oral Daily  Pavan Pyreddy, MD   17 g at 11/17/15 1000  . risperiDONE (RISPERDAL) tablet 2 mg  2 mg Oral Q T,Th,Sa-HD Ihor Austin, MD   2 mg at 11/18/15 1406  . senna-docusate (Senokot-S) tablet 1 tablet  1 tablet Oral QHS PRN Ihor Austin, MD      . sertraline (ZOLOFT) tablet 50 mg  50 mg Oral Daily Ihor Austin, MD   50 mg at 11/19/15 6213  . sevelamer carbonate (RENVELA) packet 2.4 g  2.4 g Oral TID WC Enedina Finner, MD   2.4 g at 11/19/15 0865  . sodium chloride 0.9 % injection 3 mL  3 mL Intravenous Q12H Pavan Pyreddy, MD   3 mL at 11/19/15 1000  . sodium chloride 0.9 % injection 3 mL  3 mL Intravenous Q12H Pavan Pyreddy, MD   3 mL at 11/19/15 1000  . sodium chloride 0.9 % injection 3 mL  3 mL Intravenous PRN Ihor Austin, MD      . Melene Muller ON 11/21/2015] vancomycin (VANCOCIN) 500 mg in sodium chloride 0.9 % 100 mL IVPB  500 mg Intravenous Q T,Th,Sa-HD Pavan Pyreddy, MD      . vitamin B-12 (CYANOCOBALAMIN) tablet 1,000 mcg  1,000 mcg Oral Daily Ihor Austin, MD   1,000 mcg at 11/19/15 7846   Past Medical History  Diagnosis Date  . HTN (hypertension)   . Diabetes mellitus, type II (HCC)   . Hearing loss   . Memory loss   . Chronic kidney disease (CKD), stage V (HCC)     GFR 2  . Dementia   . Peripheral vascular disease [facility])     Past Surgical History  Procedure Laterality Date  . Back surgery     Social History Social History  Substance Use Topics  . Smoking status: Former Games developer  . Smokeless tobacco: Never Used  . Alcohol Use: No   Family History Family History  Problem Relation Age of Onset  . Rheum arthritis Neg Hx   . Osteoarthritis Neg Hx   . Asthma Neg Hx   . Cancer Neg Hx   Unable to obtain a family history as patient has dementia and no family members present at bedside.   Allergies  Allergen Reactions  . Shellfish Allergy Other (See Comments)    Reaction:  Unknown    REVIEW OF SYSTEMS (Negative unless checked) Unable to obtain as patient has dementia and no family  members present at bedside.   Constitutional: [] Weight loss  [] Fever  [] Chills Cardiac: [] Chest pain   [] Chest pressure   [] Palpitations   [] Shortness of breath when laying flat   [] Shortness of breath at rest   [] Shortness of breath with exertion. Vascular:  [] Pain in legs with walking   [] Pain in legs at rest   []   Pain in legs when laying flat   [] Claudication   [] Pain in feet when walking  [] Pain in feet at rest  [] Pain in feet when laying flat   [] History of DVT   [] Phlebitis   [] Swelling in legs   [] Varicose veins   [] Non-healing ulcers Pulmonary:   [] Uses home oxygen   [] Productive cough   [] Hemoptysis   [] Wheeze  [] COPD   [] Asthma Neurologic:  [] Dizziness  [] Blackouts   [] Seizures   [] History of stroke   [] History of TIA  [] Aphasia   [] Temporary blindness   [] Dysphagia   [] Weakness or numbness in arms   [] Weakness or numbness in legs Musculoskeletal:  [] Arthritis   [] Joint swelling   [] Joint pain   [] Low back pain Hematologic:  [] Easy bruising  [] Easy bleeding   [] Hypercoagulable state   [] Anemic  [] Hepatitis Gastrointestinal:  [] Blood in stool   [] Vomiting blood  [] Gastroesophageal reflux/heartburn   [] Difficulty swallowing. Genitourinary:  [] Chronic kidney disease   [] Difficult urination  [] Frequent urination  [] Burning with urination   [] Blood in urine Skin:  [] Rashes   [] Ulcers   [] Wounds Psychological:  [] History of anxiety   []  History of major depression.  Physical Examination  Filed Vitals:   11/19/15 0027 11/19/15 0350 11/19/15 0718 11/19/15 1158  BP: 113/42 142/46 121/43 162/68  Pulse: 70 65 59 66  Temp: 98.4 F (36.9 C) 97.7 F (36.5 C) 98.5 F (36.9 C) 98.7 F (37.1 C)  TempSrc: Oral Oral Axillary Axillary  Resp: 18 17 17 17   Height:      Weight:      SpO2: 100% 100% 100% 100%   Body mass index is 23.07 kg/(m^2).  Gen:  Has dementia, confused unable to answer appropriately, NAD Head: [place]/AT, No temporalis wasting. Prominent temp pulse not noted. Ear/Nose/Throat:  Hearing grossly intact, nares w/o erythema or drainage, oropharynx w/o Erythema/Exudate Eyes: PERRLA, EOMI.  Neck: Supple, no nuchal rigidity.  No bruit or JVD.  Pulmonary:  Good air movement, clear to auscultation bilaterally.  Cardiac: RRR, normal S1, S2, no Murmurs, rubs or gallops.  Vascular:  Vessel Right Left  Radial Palpable Palpable  Ulnar Palpable Palpable  Brachial Palpable Palpable  Carotid Palpable, without bruit Palpable, without bruit  Aorta Not palpable N/A  Femoral Palpable Palpable  Popliteal Non-Palpable Non-Palpable  PT Non-Palpable Non-Palpable  DP Non- Palpable Non-Palpable   Left Lower Extremity: Thigh warm and soft, Calf cool but soft. Left heel wound is stable and healed.  Right Lower Extremity: Thigh warm and soft, Calf cool but soft. Right wound is severely necrotic and gangrenous eschar to the posterior and lateral aspect of the heel. The wound is large 8 x 6 cm and likely involves deeper tissues.   Gastrointestinal: soft, non-tender/non-distended. No guarding/reflex. No masses, surgical incisions, or scars. Musculoskeletal: M/S 5/5 throughout.  Extremities without ischemic changes.  No deformity or atrophy. No edema. Neurologic: CN 2-12 intact. Pain and light touch intact in extremities.  Symmetrical.  Speech is fluent. Motor exam as listed above. Psychiatric: Patient with dementia, however calm Lymph : No Cervical, Axillary, or Inguinal lymphadenopathy.  CBC Lab Results  Component Value Date   WBC 7.1 11/19/2015   HGB 9.8* 11/19/2015   HCT 31.3* 11/19/2015   MCV 91.3 11/19/2015   PLT 159 11/19/2015   BMET    Component Value Date/Time   NA 142 11/18/2015 0442   NA 141 10/22/2014 1056   K 3.6 11/18/2015 0442   K 4.1 10/22/2014 1056   CL 103  11/18/2015 0442   CL 102 10/22/2014 1056   CO2 30 11/18/2015 0442   CO2 31 10/22/2014 1056   GLUCOSE 82 11/18/2015 0442   GLUCOSE 104* 10/22/2014 1056   BUN 12 11/18/2015 0442   BUN 27* 10/22/2014 1056    CREATININE 2.31* 11/18/2015 0442   CREATININE 4.06* 10/22/2014 1056   CALCIUM 8.8* 11/18/2015 0442   CALCIUM 8.0* 10/22/2014 1056   GFRNONAA 17* 11/18/2015 0442   GFRNONAA 11* 10/22/2014 1056   GFRNONAA 16* 03/29/2014 0931   GFRAA 20* 11/18/2015 0442   GFRAA 13* 10/22/2014 1056   GFRAA 19* 03/29/2014 0931   Estimated Creatinine Clearance: 13.1 mL/min (by C-G formula based on Cr of 2.31).  COAG Lab Results  Component Value Date   INR 1.11 11/17/2015   INR 1.1 10/15/2014    Radiology Ct Head Wo Contrast  11/17/2015  CLINICAL DATA:  Lethargic.  Encephalopathy. EXAM: CT HEAD WITHOUT CONTRAST TECHNIQUE: Contiguous axial images were obtained from the base of the skull through the vertex without intravenous contrast. COMPARISON:  CT scan of September 19, 2015. FINDINGS: Bony calvarium appears intact. Mild diffuse cortical atrophy is noted. Moderate chronic ischemic white matter disease is noted. No mass effect or midline shift is noted. Ventricular size is within normal limits. There is no evidence of mass lesion, hemorrhage or acute infarction. IMPRESSION: Mild diffuse cortical atrophy. Moderate chronic ischemic white matter disease. No acute intracranial abnormality seen. Electronically Signed   By: Lupita RaiderJames  Green Jr, M.D.   On: 11/17/2015 15:27   Dg Chest Port 1 View  11/17/2015  CLINICAL DATA:  Acute onset of generalized chest pain. Initial encounter. EXAM: PORTABLE CHEST 1 VIEW COMPARISON:  Chest radiograph performed 06/19/2015 FINDINGS: The lungs are well-aerated. Mild left basilar opacity may reflect atelectasis or possibly mild pneumonia. Mild vascular congestion is noted. There is no evidence of pleural effusion or pneumothorax. The cardiomediastinal silhouette is within normal limits. A left-sided dual-lumen catheter is noted ending about the cavoatrial junction. No acute osseous abnormalities are seen. IMPRESSION: Mild left basilar airspace opacity may reflect atelectasis or possibly mild  pneumonia. Mild vascular congestion noted. Electronically Signed   By: Roanna RaiderJeffery  Chang M.D.   On: 11/17/2015 02:08   Dg Foot Complete Right  11/17/2015  CLINICAL DATA:  Discoloration at the right toes, acute onset. Initial encounter. EXAM: RIGHT FOOT COMPLETE - 3+ VIEW COMPARISON:  None. FINDINGS: There is no evidence of fracture or dislocation. The joint spaces are preserved. There is no evidence of talar subluxation; the subtalar joint is unremarkable in appearance. Diffuse vascular calcifications are seen. IMPRESSION: 1. No evidence of fracture or dislocation. 2. Diffuse vascular calcifications seen. Electronically Signed   By: Roanna RaiderJeffery  Chang M.D.   On: 11/17/2015 02:09   Assessment/Plan The patient is a 82 year old female with a past medical history of dementia - power of attorney is family member. She has a compensated medical history including diabetes, hypertension, renal failure on hemodialysis Tuesday, Thursday, Saturday. Patient has a chronic right diabetic heel ulcer which has been treated at the nursing facility. Admitted for worsening right lower extremity ulceration. 1) Agree patient is poor candidate for surgical intervention, high risk with multiple medical issues. 2) Would recommend palliative care at this point, however if patient's power of attorney wants surgical intervention, would consider an AKA.  3) Will make patient NPO after midnight tonight, if patients power of attorney would like to proceed with amputation it will happen tomorrow with Dr. Wyn Quakerew 4) Discussed with  Dr. Weldon Inches, PA-C  11/19/2015 2:15 PM

## 2015-11-20 ENCOUNTER — Ambulatory Visit: Payer: Medicare Other | Admitting: Surgery

## 2015-11-20 LAB — BASIC METABOLIC PANEL
Anion gap: 8 (ref 5–15)
BUN: 33 mg/dL — ABNORMAL HIGH (ref 6–20)
CHLORIDE: 100 mmol/L — AB (ref 101–111)
CO2: 33 mmol/L — ABNORMAL HIGH (ref 22–32)
CREATININE: 4.39 mg/dL — AB (ref 0.44–1.00)
Calcium: 9.5 mg/dL (ref 8.9–10.3)
GFR, EST AFRICAN AMERICAN: 9 mL/min — AB (ref >60)
GFR, EST NON AFRICAN AMERICAN: 8 mL/min — AB (ref >60)
Glucose, Bld: 146 mg/dL — ABNORMAL HIGH (ref 65–99)
POTASSIUM: 4 mmol/L (ref 3.5–5.1)
SODIUM: 141 mmol/L (ref 135–145)

## 2015-11-20 LAB — CBC
HCT: 32.8 % — ABNORMAL LOW (ref 35.0–47.0)
HEMOGLOBIN: 10.4 g/dL — AB (ref 12.0–16.0)
MCH: 28.6 pg (ref 26.0–34.0)
MCHC: 31.7 g/dL — AB (ref 32.0–36.0)
MCV: 90.5 fL (ref 80.0–100.0)
Platelets: 186 10*3/uL (ref 150–440)
RBC: 3.62 MIL/uL — AB (ref 3.80–5.20)
RDW: 16.1 % — ABNORMAL HIGH (ref 11.5–14.5)
WBC: 9 10*3/uL (ref 3.6–11.0)

## 2015-11-20 LAB — TYPE AND SCREEN
ABO/RH(D): O POS
ANTIBODY SCREEN: NEGATIVE

## 2015-11-20 LAB — HEPARIN LEVEL (UNFRACTIONATED): Heparin Unfractionated: 0.49 IU/mL (ref 0.30–0.70)

## 2015-11-20 LAB — ABO/RH: ABO/RH(D): O POS

## 2015-11-20 MED ORDER — MORPHINE SULFATE (PF) 2 MG/ML IV SOLN: 1.0000 mg | INTRAVENOUS | Status: DC | PRN

## 2015-11-20 NOTE — Care Management (Addendum)
Hospice screen requested per RN. Maralyn SagoSarah CSW is checking with WOM to see if they have a preference for hospice. WOM has no preference per Maralyn SagoSarah CSW. I spoke with patient's guardian Maryelizabeth KaufmannLeslie Foster [phone] and they would like to use [facility] "[address] I have made referral to Clydie BraunKaren with [facility]. Plan is for [facility] unless patient is appropriate for hospice home. I have been notified by Clydie BraunKaren with [facility] that patient may discharge to hospice home tomorrow if guardian agrees. I have left message for Verlon AuLeslie to call this RNCM back as to where she wants patient to go per Maralyn SagoSarah CSW/Karen RN with [facility] (hospice home of [facility]OM with Hospice).

## 2015-11-20 NOTE — Progress Notes (Signed)
ANTICOAGULATION CONSULT NOTE - follow up  Pharmacy Consult for heparin Indication: PAD  Allergies  Allergen Reactions  . Shellfish Allergy Other (See Comments)    Reaction:  Unknown     Patient Measurements: Height: 5\' 4"  (162.6 cm) Weight: 134 lb 7.7 oz (61 kg) IBW/kg (Calculated) : 54.7 Heparin Dosing Weight: 60 kg  Vital Signs: Temp: 98.6 F (37 C) (01/08 2114) Temp Source: Oral (01/08 2114) BP: 146/54 mmHg (01/08 2114) Pulse Rate: 66 (01/08 2114)  Labs:  Recent Labs  11/17/15 0534  11/18/15 0442  11/19/15 0500 11/19/15 1611 11/20/15 0019  HGB 11.3*  --  10.5*  --  9.8*  --   --   HCT 35.7  --  32.8*  --  31.3*  --   --   PLT 170  --  149*  --  159  --   --   HEPARINUNFRC  --   < >  --   < > 0.23* 0.44 0.49  CREATININE 2.72*  --  2.31*  --   --   --   --   < > = values in this interval not displayed.  Estimated Creatinine Clearance: 13.1 mL/min (by C-G formula based on Cr of 2.31).   Medical History: Past Medical History  Diagnosis Date  . HTN (hypertension)   . Diabetes mellitus, type II (HCC)   . Hearing loss   . Memory loss   . Chronic kidney disease (CKD), stage V (HCC)     GFR 2  . Dementia   . Peripheral vascular disease (HCC)     Medications:  Infusions:  . heparin 1,000 Units/hr (11/19/15 16100907)    Assessment: 89 yof cc full code status [facility]. Some PAD with extremity discoloration per nursing, pharmacy consulted to dose heparin.   Heparin level resulted at 0.44.  Goal of Therapy:  Heparin level 0.3-0.7 units/ml Monitor platelets by anticoagulation protocol: Yes   Plan:  Heparin level therapeutic. Continue current rate. Pharmacy will continue to monitor daily.   Carola FrostNathan A Johnchristopher Sarvis, Pharm.D., BCPS Clinical Pharmacist 11/20/2015,1:05 AM

## 2015-11-20 NOTE — Care Management (Signed)
[facility]. Dialysis  M, W, F. Per nursing staff family has requested Palliative.

## 2015-11-20 NOTE — Progress Notes (Signed)
[facility] [place] [facility]   PATIENT NAME: Caroline HawkingVivian Steinberger    MR#:  [id number]  DATE OF BIRTH:  [birth year]  SUBJECTIVE:   pt is sleepy although awakens upon verbal commands. Remains lethargic. No family in the room Staff not able to give po meds REVIEW OF SYSTEMS:   Review of Systems  Unable to perform ROS: acuity of condition   Tolerating Diet: no Tolerating PT: No  DRUG ALLERGIES:   Allergies  Allergen Reactions  . Shellfish Allergy Other (See Comments)    Reaction:  Unknown     VITALS:  Blood pressure 148/54, pulse 57, temperature 98.4 F (36.9 C), temperature source Axillary, resp. rate 17, height 5\' 4"  (1.626 m), weight 61 kg (134 lb 7.7 oz), SpO2 100 %.  PHYSICAL EXAMINATION:   Physical Exam  GENERAL:  82 y.o.-year-old patient lying in the bed with no acute distress.  EYES: Pupils equal, round, reactive to light and accommodation. No scleral icterus. Extraocular muscles intact.  HEENT: Head atraumatic, normocephalic. Oropharynx and nasopharynx clear. Oral mucosa dry NECK:  Supple, no jugular venous distention. No thyroid enlargement, no tenderness.  LUNGS: Normal breath sounds bilaterally, no wheezing, rales, rhonchi. No use of accessory muscles of respiration.  CARDIOVASCULAR: S1, S2 normal. No murmurs, rubs, or gallops.  ABDOMEN: Soft, nontender, nondistended. Bowel sounds present. No organomegaly or mass.  EXTREMITIES: No cyanosis, clubbing or edema b/l.  Patient has significant foul smelling from left heel wound NEUROLOGIC:unable to assess PSYCHIATRIC: Sleepy  SKIN: No obvious rash, lesion, or ulcer.   LABORATORY PANEL:   CBC  Recent Labs Lab 11/20/15 0418  WBC 9.0  HGB 10.4*  HCT 32.8*  PLT 186    Chemistries   Recent Labs Lab 11/17/15 0101  11/20/15 0418  NA 138  < > 141  K 3.2*  < > 4.0  CL 101  < > 100*  CO2 31  < > 33*  GLUCOSE 113*  < > 146*  BUN 15  < > 33*  CREATININE 2.65*  < > 4.39*  CALCIUM  8.9  < > 9.5  AST 18  --   --   ALT 14  --   --   ALKPHOS 77  --   --   BILITOT 0.8  --   --   < > = values in this interval not displayed.  Cardiac Enzymes No results for input(s): TROPONINI in the last 168 hours.  RADIOLOGY:  No results found.   ASSESSMENT AND PLAN:   82 year old female patient with history of end-stage renal disease on dialysis, hypertension, diabetes mellitus, dementia presented to the ER with right heel ulcer, blackish discoloration of the right foot and toes with poor circulation.  1. Nonhealing right foot ulcer with possible right foot osteomyelitis/ Gangrene right foot Consult with podiatry noted and pt is not  A good  surgical candidate. Vascular surgery consult noted recommends AKA but ppor candidate -IV Zosyn antibiotic -Anticoagulate patient with IV heparin drip -spoke with family (leslie foster-HCPOA) at length. Family in requesting palliative care and hospice screening. They are aslo agreement for No HD any more. Nephrology informed  2. ESRD on dialysis- f/u with nephrology  -NO HD since pt is comfort care   4.. Dementia Supportive care prn   5. HTN  Case discussed with Care Management/Social Worker. Management plans discussed with the patient, family and they are in agreement.  CODE STATUS: DNR  DVT Prophylaxis: Lovenox  TOTAL TIME TAKING CARE  OF THIS PATIENT: 25 minutes.  >50% time spent on counselling and coordination of care   D/C  DEPENDING ON CLINICAL CONDITION.   Yalonda Sample M.D on 11/20/2015 at 4:39 PM  Between 7am to 6pm - Pager - [phone]  After 6pm go to [email or web address] - password [email or web address]  [facility]  Office  [phone]  CC: Primary care physician; Pcp Not In System

## 2015-11-20 NOTE — Care Management Important Message (Signed)
Important Message  Patient Details  Name: Caroline Flores MRN: [id number] Date of Birth: 1920/05/13   Medicare Important Message Given:  Yes    [name] 11/20/2015, 1:45 PM

## 2015-11-20 NOTE — Progress Notes (Signed)
Spoke at length with patient's grand niece Maryelizabeth KaufmannLeslie Foster over the phone and had a conference call with Maryelizabeth KaufmannLeslie Foster sister. Family came to a conclusion with patient being now only on comfort measures. They understand patient's poor medical condition including failure to thrive. They are in agreement to discontinue hemodialysis as well. Patient is family is in agreement with the DO NOT RESUSCITATE and hospice screening. Spoke with hospice RN Dayna BarkerKaren Robertson. Family is okay for patient to transfer to hospice home if possible. Time spent 25 minutes.

## 2015-11-21 DIAGNOSIS — E119 Type 2 diabetes mellitus without complications: Secondary | ICD-10-CM

## 2015-11-21 DIAGNOSIS — I10 Essential (primary) hypertension: Secondary | ICD-10-CM

## 2015-11-21 DIAGNOSIS — E11621 Type 2 diabetes mellitus with foot ulcer: Secondary | ICD-10-CM | POA: Insufficient documentation

## 2015-11-21 DIAGNOSIS — L97519 Non-pressure chronic ulcer of other part of right foot with unspecified severity: Secondary | ICD-10-CM

## 2015-11-21 DIAGNOSIS — I96 Gangrene, not elsewhere classified: Secondary | ICD-10-CM

## 2015-11-21 DIAGNOSIS — R400 Somnolence: Secondary | ICD-10-CM

## 2015-11-21 DIAGNOSIS — N186 End stage renal disease: Secondary | ICD-10-CM

## 2015-11-21 DIAGNOSIS — F0390 Unspecified dementia without behavioral disturbance: Secondary | ICD-10-CM

## 2015-11-21 LAB — CULTURE, BLOOD (ROUTINE X 2)

## 2015-11-21 LAB — CBC WITH DIFFERENTIAL/PLATELET
BASOS ABS: 0 10*3/uL (ref 0–0.1)
BASOS PCT: 0 %
Eosinophils Absolute: 0.1 10*3/uL (ref 0–0.7)
Eosinophils Relative: 1 %
HEMATOCRIT: 34 % — AB (ref 35.0–47.0)
HEMOGLOBIN: 10.8 g/dL — AB (ref 12.0–16.0)
LYMPHS PCT: 10 %
Lymphs Abs: 0.8 10*3/uL — ABNORMAL LOW (ref 1.0–3.6)
MCH: 28.6 pg (ref 26.0–34.0)
MCHC: 31.7 g/dL — AB (ref 32.0–36.0)
MCV: 90.2 fL (ref 80.0–100.0)
MONOS PCT: 7 %
Monocytes Absolute: 0.6 10*3/uL (ref 0.2–0.9)
NEUTROS ABS: 6.9 10*3/uL — AB (ref 1.4–6.5)
NEUTROS PCT: 82 %
Platelets: 164 10*3/uL (ref 150–440)
RBC: 3.77 MIL/uL — ABNORMAL LOW (ref 3.80–5.20)
RDW: 15.8 % — ABNORMAL HIGH (ref 11.5–14.5)
WBC: 8.5 10*3/uL (ref 3.6–11.0)

## 2015-11-21 MED ORDER — ACETAMINOPHEN 650 MG RE SUPP: 650.0000 mg | Freq: Four times a day (QID) | RECTAL | Status: AC | PRN

## 2015-11-21 MED ORDER — PROCHLORPERAZINE 25 MG RE SUPP: 25.0000 mg | Freq: Three times a day (TID) | RECTAL | Status: DC | PRN

## 2015-11-21 MED ORDER — LORAZEPAM 0.5 MG PO TABS: 0.5000 mg | ORAL_TABLET | ORAL | Status: DC | PRN

## 2015-11-21 MED ORDER — MORPHINE SULFATE (CONCENTRATE) 10 MG/0.5ML PO SOLN: 5.0000 mg | ORAL | Status: AC | PRN

## 2015-11-21 MED ORDER — LORAZEPAM 0.5 MG PO TABS: 0.5000 mg | ORAL_TABLET | ORAL | Status: AC | PRN

## 2015-11-21 MED ORDER — MORPHINE SULFATE (CONCENTRATE) 10 MG/0.5ML PO SOLN
5.0000 mg | ORAL | Status: DC | PRN
Start: 2015-11-21 — End: 2015-11-21
  Administered 2015-11-21: 5 mg via ORAL
  Filled 2015-11-21 (×2): qty 1

## 2015-11-21 MED ORDER — GLYCOPYRROLATE 1 MG PO TABS: 1.0000 mg | ORAL_TABLET | Freq: Three times a day (TID) | ORAL | Status: AC | PRN

## 2015-11-21 MED ORDER — MORPHINE SULFATE (CONCENTRATE) 10 MG/0.5ML PO SOLN
5.0000 mg | Freq: Four times a day (QID) | ORAL | Status: DC
  Administered 2015-11-21: 5 mg via ORAL

## 2015-11-21 MED ORDER — GLYCOPYRROLATE 1 MG PO TABS: 1.0000 mg | ORAL_TABLET | Freq: Three times a day (TID) | ORAL | Status: DC | PRN

## 2015-11-21 MED ORDER — BISACODYL 10 MG RE SUPP: 10.0000 mg | Freq: Every day | RECTAL | Status: DC | PRN

## 2015-11-21 MED ORDER — PROCHLORPERAZINE 25 MG RE SUPP: 25.0000 mg | Freq: Three times a day (TID) | RECTAL | Status: AC | PRN

## 2015-11-21 MED ORDER — BISACODYL 10 MG RE SUPP: 10.0000 mg | Freq: Every day | RECTAL | Status: AC | PRN

## 2015-11-21 NOTE — Discharge Summary (Signed)
[facility] Physicians - [place] at [facility]   PATIENT NAME: Caroline Flores    MR#:  [id number]  DATE OF BIRTH:  January 11, 1920  DATE OF ADMISSION:  11/17/2015 ADMITTING PHYSICIAN: Ihor Austin, MD  DATE OF DISCHARGE: 11/21/15  PRIMARY CARE PHYSICIAN: Pcp Not In System    ADMISSION DIAGNOSIS:  PAD (peripheral artery disease) (HCC) [I73.9] ESRD on hemodialysis (HCC) [N18.6, Z99.2] Dementia, without behavioral disturbance [F03.90] Type 2 diabetes mellitus with other specified complication (HCC) [E11.69] Diabetic ulcer of right foot associated with type 2 diabetes mellitus (HCC) [W09.811, L97.519]  DISCHARGE DIAGNOSIS:  Sever foul smelling infected left foot infection due to severe PVD Failure to thrive ESRD  SECONDARY DIAGNOSIS:   Past Medical History  Diagnosis Date  . HTN (hypertension)   . Diabetes mellitus, type II (HCC)   . Hearing loss   . Memory loss   . Chronic kidney disease (CKD), stage V (HCC)     GFR 2  . Dementia   . Peripheral vascular disease [facility])     HOSPITAL COURSE:   82 year old female patient with history of end-stage renal disease on dialysis, hypertension, diabetes mellitus, dementia presented to the ER with right heel ulcer, blackish discoloration of the right foot and toes with poor circulation.  1. Nonhealing right foot ulcer with possible right foot osteomyelitis/ Gangrene right foot Consulted podiatry and vascular pt is not A good surgical candidate. Vascular surgery consult noted recommends AKA but ppor candidate -spoke with family (leslie foster-HCPOA) at length. Family in requesting palliative care and hospice screening. They are aslo agreement for No HD any more. Nephrology informed -seen by Dr Orvan Falconer today. Family wants pt to go to hospice home. Pt will be d/ced to Hospice home later today  2. ESRD on dialysis- f/u with nephrology  -NO HD since pt is comfort care   3. Dementia /failure to thrive  Overall poor  prognsosis   CONSULTS OBTAINED:  Treatment Team:  Lamont Dowdy, MD Tonette Lederer, PA-C  DRUG ALLERGIES:   Allergies  Allergen Reactions  . Shellfish Allergy Other (See Comments)    Reaction:  Unknown     DISCHARGE MEDICATIONS:   Current Discharge Medication List    START taking these medications   Details  acetaminophen (TYLENOL) 650 MG suppository Place 1 suppository (650 mg total) rectally every 6 (six) hours as needed for mild pain (or Fever >/= 101). Qty: 12 suppository, Refills: 0    bisacodyl (DULCOLAX) 10 MG suppository Place 1 suppository (10 mg total) rectally daily as needed for moderate constipation. Qty: 6 suppository, Refills: 0    glycopyrrolate (ROBINUL) 1 MG tablet Take 1 tablet (1 mg total) by mouth 3 (three) times daily as needed (excessive secretions). Qty: 15 tablet, Refills: 0    LORazepam (ATIVAN) 0.5 MG tablet Take 1 tablet (0.5 mg total) by mouth every 4 (four) hours as needed for anxiety. Qty: 15 tablet, Refills: 0    Morphine Sulfate (MORPHINE CONCENTRATE) 10 MG/0.5ML SOLN concentrated solution Take 0.25 mLs (5 mg total) by mouth every 2 (two) hours as needed for moderate pain or shortness of breath. Qty: 30 mL, Refills: 0    prochlorperazine (COMPAZINE) 25 MG suppository Place 1 suppository (25 mg total) rectally every 8 (eight) hours as needed for nausea or vomiting. Qty: 6 suppository, Refills: 0      STOP taking these medications     acetaminophen (TYLENOL) 500 MG tablet      amLODipine (NORVASC) 10 MG tablet  clopidogrel (PLAVIX) 75 MG tablet      Cyanocobalamin (VITAMIN B12) 1000 MCG TBCR      diazepam (VALIUM) 5 MG tablet      hydrALAZINE (APRESOLINE) 25 MG tablet      isosorbide mononitrate (IMDUR) 120 MG 24 hr tablet      metoprolol (LOPRESSOR) 50 MG tablet      Nutritional Supplements (FEEDING SUPPLEMENT, NEPRO CARB STEADY,) LIQD      polyethylene glycol (MIRALAX / GLYCOLAX) packet      ranitidine  (ZANTAC) 150 MG tablet      risperiDONE (RISPERDAL) 2 MG tablet      sertraline (ZOLOFT) 50 MG tablet      sevelamer carbonate (RENVELA) 800 MG tablet      sodium chloride 0.9 % injection      traMADol (ULTRAM) 50 MG tablet      lisinopril (PRINIVIL,ZESTRIL) 20 MG tablet         If you experience worsening of your admission symptoms, develop shortness of breath, life threatening emergency, suicidal or homicidal thoughts you must seek medical attention immediately by calling 911 or calling your MD immediately  if symptoms less severe.  You Must read complete instructions/literature along with all the possible adverse reactions/side effects for all the Medicines you take and that have been prescribed to you. Take any new Medicines after you have completely understood and accept all the possible adverse reactions/side effects.   Please note  You were cared for by a hospitalist during your hospital stay. If you have any questions about your discharge medications or the care you received while you were in the hospital after you are discharged, you can call the unit and asked to speak with the hospitalist on call if the hospitalist that took care of you is not available. Once you are discharged, your primary care physician will handle any further medical issues. Please note that NO REFILLS for any discharge medications will be authorized once you are discharged, as it is imperative that you return to your primary care physician (or establish a relationship with a primary care physician if you do not have one) for your aftercare needs so that they can reassess your need for medications and monitor your lab values.  Management plans discussed with the  family and they are in agreement.  CODE STATUS:     Code Status Orders        Start     Ordered   11/20/15 1418  Do not attempt resuscitation (DNR)   Continuous    Question Answer Comment  In the event of cardiac or respiratory ARREST Do  not call a "code blue"   In the event of cardiac or respiratory ARREST Do not perform Intubation, CPR, defibrillation or ACLS   In the event of cardiac or respiratory ARREST Use medication by any route, position, wound care, and other measures to relive pain and suffering. May use oxygen, suction and manual treatment of airway obstruction as needed for comfort.      11/20/15 1417    Code Status History    Date Active Date Inactive Code Status Order ID Comments User Context   11/17/2015  4:15 AM 11/20/2015  2:17 PM Full Code [id number]  Ihor Austin, MD Inpatient   06/20/2015  5:22 AM 06/28/2015  8:03 PM Full Code [id number]  Crissie Figures, MD Inpatient      TOTAL TIME TAKING CARE OF THIS PATIENT: 40 minutes.    Devynn Hessler M.D on 11/21/2015 at  12:12 PM  Between 7am to 6pm - Pager - [phone] After 6pm go to [email or web address] - password [email or web address]  [facility] [place] [facility]  Office  [phone]  CC: Primary care physician; Pcp Not In System

## 2015-11-21 NOTE — Progress Notes (Addendum)
New hospice home referral received from [place] following a Palliative Medicine consult. Patient to [facility] from [facility] for assessment/treatment of gangrenous wounds to her right foot/toes. Patient also with dialysis dependent ESRD. She has been treated with IV antibiotics and IV heparin and has been deemed not to be a surgical candidate. Her niece Renne Musca has met with the Palliative Medicine physician, Dr. Megan Salon and has chosen for her aunt to be discharged to the hospice home for end of life care and symptom management.  Patient seen lying in bed, odor noted in room, patient with grimace on her face, she is currently receiving scheduled liquid morphine concentrate 5 mg q 6 hours for pain. She did respond to voice, with eyes remaining closed.  Writer spoke with Magda Paganini via phone to initiate education regarding hospice services, philosophy of and team approach to care with good understanding voiced. Magda Paganini chose to go directly to the hospice home to sign paper work. Hospital care team made aware of and in agreement with discharge this afternoon via EMS with portable DNR in place. Updated patient information faxed to referral. Report called to hospice home. EMS notified for transport. Writer contacted Tiffany at [facility] to advise her that Ms. Kelch would not be returning to their facility. Thank you for the opportunity to be involved in this care of this patient. Flo Shanks RN, BSn, [place] [facility] [place], [facility] [phone] c

## 2015-11-21 NOTE — Care Management Note (Signed)
I [facility] that patient is discharging under the care of Hospice and will not be returning to dialysis.  Ivor ReiningKim Dakia Schifano   Dialysis Liaison   [phone]

## 2015-11-21 NOTE — Consult Note (Signed)
Palliative Medicine Inpatient Consult Note   Name: Caroline Flores Date: 11/21/2015 MRN: [id number]  DOB: Apr 09, 1920  Referring Physician: Enedina Finner, MD  Palliative Care consult requested for this 82 y.o. female for goals of medical therapy in patient with a necrotic foot ulcer and ESRD.  Family members have opted to stop dialysis and to initiate comfort only care.    TODAY'S DISCUSSIONS AND DECISIONS: Pt is generally unresponsive but is noted to grimace at times due to pain from gangrenous decubitus. I am now ordering some routine low dose Roxanol to be sure she gets something for pain.   I called pt's niece (legal guardian per report --no copy of this documentation in chart as yet). She is just about to have all of her top teeth extracted (she was literally in the dental chair as she spoke to me).She was presented with options for having pt go back to [facility] or [facility]. She was given answers to her questions about each scenario in an unbiased manner. She wants her mother to go to [facility].  She thinks she will be over her dental procedure and able to talk by around 2pm.   I have updated attending (Dr Enedina Finner), and Marylene Land [facility]).   I will alter meds a bit and do the discharge med rec list.    ---------------------------------------------------------------------------  IMPRESSION: Right necrotic foot ulcer  --with osteomyelitis and gangrene ---nonhealing No amputation desired ESRD ---with HD to be stopped HTN DM2 PVD Dementia  ---was on Risperdal Hearing Loss Anxiety and depression GERD    REVIEW OF SYSTEMS:  Patient is not able to provide ROS due to illness  SPIRITUAL SUPPORT SYSTEM: family  SOCIAL HISTORY:  reports that she has quit smoking. She has never used smokeless tobacco. She reports that she does not drink alcohol or use illicit drugs.  Formerly lived at [facility]. Niece desires Hospice Home instead of having pt go back  to [facility] (per her request).   LEGAL DOCUMENTS:  DNR   CODE STATUS: DNR  PAST MEDICAL HISTORY: Past Medical History  Diagnosis Date  . HTN (hypertension)   . Diabetes mellitus, type II (HCC)   . Hearing loss   . Memory loss   . Chronic kidney disease (CKD), stage V (HCC)     GFR 2  . Dementia   . Peripheral vascular disease (HCC)     PAST SURGICAL HISTORY:  Past Surgical History  Procedure Laterality Date  . Back surgery      ALLERGIES:  is allergic to shellfish allergy.  MEDICATIONS:  Current Facility-Administered Medications  Medication Dose Route Frequency Provider Last Rate Last Dose  . acetaminophen (TYLENOL) suppository 650 mg  650 mg Rectal Q6H PRN Ihor Austin, MD      . bisacodyl (DULCOLAX) suppository 10 mg  10 mg Rectal Daily PRN Suan Halter, MD      . glycopyrrolate (ROBINUL) tablet 1 mg  1 mg Oral TID PRN Suan Halter, MD      . LORazepam (ATIVAN) tablet 0.5 mg  0.5 mg Oral Q4H PRN Suan Halter, MD      . morphine CONCENTRATE 10 MG/0.5ML oral solution 5 mg  5 mg Oral Q2H PRN Suan Halter, MD      . morphine CONCENTRATE 10 MG/0.5ML oral solution 5 mg  5 mg Oral Q6H Suan Halter, MD   5 mg at 11/21/15 1151  . ondansetron (ZOFRAN) injection 4 mg  4 mg Intravenous Q6H PRN Ihor AustinPavan Pyreddy, MD      . prochlorperazine (COMPAZINE) suppository 25 mg  25 mg Rectal Q8H PRN Suan HalterMargaret F Masey Scheiber, MD        Vital Signs: BP 153/52 mmHg  Pulse 67  Temp(Src) 97.7 F (36.5 C) (Oral)  Resp 18  Ht 5\' 4"  (1.626 m)  Wt 61 kg (134 lb 7.7 oz)  BMI 23.07 kg/m2  SpO2 100% Filed Weights   11/17/15 1000  Weight: 61 kg (134 lb 7.7 oz)    Estimated body mass index is 23.07 kg/(m^2) as calculated from the following:   Height as of this encounter: 5\' 4"  (1.626 m).   Weight as of this encounter: 61 kg (134 lb 7.7 oz).  PERFORMANCE STATUS (ECOG) : 4 - Bedbound  PHYSICAL EXAM: Lethargic Moans when I examined her but did not open  eyes No JVD or TM FOUL odor from gangrenous foot Hrt irreg irreg Lungs cta ant Abd soft and Nt Skin with foul smelling gangrenous foot ulcer that is dressed  LABS: CBC:    Component Value Date/Time   WBC 8.5 11/21/2015 0453   WBC 6.7 10/22/2014 1056   HGB 10.8* 11/21/2015 0453   HGB 9.3* 10/22/2014 1056   HCT 34.0* 11/21/2015 0453   HCT 29.5* 10/22/2014 1056   PLT 164 11/21/2015 0453   PLT 182 10/22/2014 1056   MCV 90.2 11/21/2015 0453   MCV 84 10/22/2014 1056   NEUTROABS 6.9* 11/21/2015 0453   NEUTROABS 4.8 10/22/2014 1056   LYMPHSABS 0.8* 11/21/2015 0453   LYMPHSABS 0.9* 10/22/2014 1056   MONOABS 0.6 11/21/2015 0453   MONOABS 0.7 10/22/2014 1056   EOSABS 0.1 11/21/2015 0453   EOSABS 0.2 10/22/2014 1056   BASOSABS 0.0 11/21/2015 0453   BASOSABS 0.0 10/22/2014 1056   Comprehensive Metabolic Panel:    Component Value Date/Time   NA 141 11/20/2015 0418   NA 141 10/22/2014 1056   K 4.0 11/20/2015 0418   K 4.1 10/22/2014 1056   CL 100* 11/20/2015 0418   CL 102 10/22/2014 1056   CO2 33* 11/20/2015 0418   CO2 31 10/22/2014 1056   BUN 33* 11/20/2015 0418   BUN 27* 10/22/2014 1056   CREATININE 4.39* 11/20/2015 0418   CREATININE 4.06* 10/22/2014 1056   GLUCOSE 146* 11/20/2015 0418   GLUCOSE 104* 10/22/2014 1056   CALCIUM 9.5 11/20/2015 0418   CALCIUM 8.0* 10/22/2014 1056   AST 18 11/17/2015 0101   AST 26 03/25/2014 1003   ALT 14 11/17/2015 0101   ALT 17 03/25/2014 1003   ALKPHOS 77 11/17/2015 0101   ALKPHOS 49 03/25/2014 1003   BILITOT 0.8 11/17/2015 0101   BILITOT 0.3 03/25/2014 1003   PROT 6.3* 11/17/2015 0101   PROT 6.3* 03/25/2014 1003   ALBUMIN 2.3* 11/17/2015 0101   ALBUMIN 2.4* 10/22/2014 1056      REFERRALS TO BE ORDERED:  Hospice for Hospice Home   More than 50% of the visit was spent in counseling/coordination of care: Yes  Time Spent:   80 minutes

## 2015-11-21 NOTE — Progress Notes (Addendum)
Palliative Care Update  Pt is generally unresponsive but is noted to grimace at times due to pain from gangrenous decubitus. I am now ordering some routine low dose Roxanol to be sure she gets something for pain.   I called pt's niece (legal  guardian per report --no copy of this documentation in chart as yet). She is just about to have all of her top teeth extracted (she was literally in the dental chair as she spoke to me).She was presented with options for [facility].  She was given answers to her questions about each scenario in an unbiased manner.  She [facility] her [facility].  She thinks she will be over her dental procedure and able to talk by around 2pm.    I have updated attending (Dr Enedina FinnerSona Patel), and [facility](Care Mgmt).    I will alter meds a bit and do the discharge med rec list.   Cammie McgeeMargaret Ferriter Hennessey Cantrell, MD Palliative Care

## 2015-11-21 NOTE — Progress Notes (Signed)
[facility] Physicians - [place] at [facility]   PATIENT NAME: Caroline Flores    MR#:  [id number]  DATE OF BIRTH:  Sep 03, 1920  SUBJECTIVE:   pt is sleepy although awakens upon verbal commands. Remains lethargic. No family in the room Staff not able to give po meds REVIEW OF SYSTEMS:   Review of Systems  Unable to perform ROS: acuity of condition   Tolerating Diet: no Tolerating PT: No  DRUG ALLERGIES:   Allergies  Allergen Reactions  . Shellfish Allergy Other (See Comments)    Reaction:  Unknown     VITALS:  Blood pressure 153/52, pulse 67, temperature 97.7 F (36.5 C), temperature source Oral, resp. rate 18, height 5\' 4"  (1.626 m), weight 61 kg (134 lb 7.7 oz), SpO2 100 %.  PHYSICAL EXAMINATION:   Physical Exam  GENERAL:  82 y.o.-year-old patient lying in the bed with no acute distress. Thin,very cacehctic EYES: Pupils equal, round, reactive to light and accommodation. No scleral icterus. Extraocular muscles intact.  HEENT: Head atraumatic, normocephalic. Oropharynx and nasopharynx clear. Oral mucosa dry NECK:  Supple, no jugular venous distention. No thyroid enlargement, no tenderness.  LUNGS: Normal breath sounds bilaterally, no wheezing, rales, rhonchi. No use of accessory muscles of respiration.  CARDIOVASCULAR: S1, S2 normal. No murmurs, rubs, or gallops.  ABDOMEN: Soft, nontender, nondistended. Bowel sounds present. No organomegaly or mass.  EXTREMITIES: No cyanosis, clubbing or edema b/l.  Patient has significant foul smelling from left heel wound NEUROLOGIC:unable to assess PSYCHIATRIC: Sleepy  SKIN: No obvious rash, lesion, or ulcer.   LABORATORY PANEL:   CBC  Recent Labs Lab 11/21/15 0453  WBC 8.5  HGB 10.8*  HCT 34.0*  PLT 164    Chemistries   Recent Labs Lab 11/17/15 0101  11/20/15 0418  NA 138  < > 141  K 3.2*  < > 4.0  CL 101  < > 100*  CO2 31  < > 33*  GLUCOSE 113*  < > 146*  BUN 15  < > 33*  CREATININE 2.65*  < >  4.39*  CALCIUM 8.9  < > 9.5  AST 18  --   --   ALT 14  --   --   ALKPHOS 77  --   --   BILITOT 0.8  --   --   < > = values in this interval not displayed.  Cardiac Enzymes No results for input(s): TROPONINI in the last 168 hours.  RADIOLOGY:  No results found.   ASSESSMENT AND PLAN:   82 year old female patient with history of end-stage renal disease on dialysis, hypertension, diabetes mellitus, dementia presented to the ER with right heel ulcer, blackish discoloration of the right foot and toes with poor circulation.  1. Nonhealing right foot ulcer with possible right foot osteomyelitis/ Gangrene right foot Consulted podiatry and vascular pt is not  A good  surgical candidate. Vascular surgery consult noted recommends AKA but ppor candidate -spoke with family (leslie foster-HCPOA) at length. Family in requesting palliative care and hospice screening. They are aslo agreement for No HD any more. Nephrology informed -seen by Dr Orvan Falconer today. Family wants pt to go to hospice home  2. ESRD on dialysis- f/u with nephrology  -NO HD since pt is comfort care   4.. Dementia Supportive care prn   Case discussed with Care Management/Social Worker.   CODE STATUS: DNR  DVT Prophylaxis: Lovenox  TOTAL TIME TAKING CARE OF THIS PATIENT: 20 minutes.  >50% time spent on  counselling and coordination of care with dr Orvan Falconercampbell   D/C  DEPENDING ON CLINICAL CONDITION.   Julianne Chamberlin M.D on 11/21/2015 at 11:23 AM  Between 7am to 6pm - Pager - [phone]  After 6pm go to [email or web address] - password [email or web address]  [facility] [place] [facility]  Office  [phone]  CC: Primary care physician; Pcp Not In System

## 2015-11-21 NOTE — Clinical Social Work Note (Addendum)
Pt is [facility] for [facility] today [facility]. CSW prepared discharge packet. Hospital Liaison made arrangements for transfer. CSW [facility]. CSW is signing off as no further needs identified.   Dede QuerySarah Malachi Kinzler, MSW, LCSW  Clinical Social Worker  [phone]

## 2015-11-23 LAB — CULTURE, BLOOD (ROUTINE X 2): Culture: NO GROWTH

## 2015-12-13 DEATH — deceased

## 2017-05-30 IMAGING — CT CT HEAD W/O CM
2 of 3 series · 16 of 30 positions shown, 18 images · non-contrast
Comparison: Head CT dated 01/21/2014.

CLINICAL DATA: Altered mental status and vomiting during dialysis.

EXAM:
CT HEAD WITHOUT CONTRAST
TECHNIQUE: Contiguous axial images were obtained from the base of the skull
through the vertex without intravenous contrast.

[Series 2: soft tissue · axial · 0.43mm/px · z∈[+320,+434]mm · 8 of 31 slices shown, 10 images]
[im 4/31  brain]
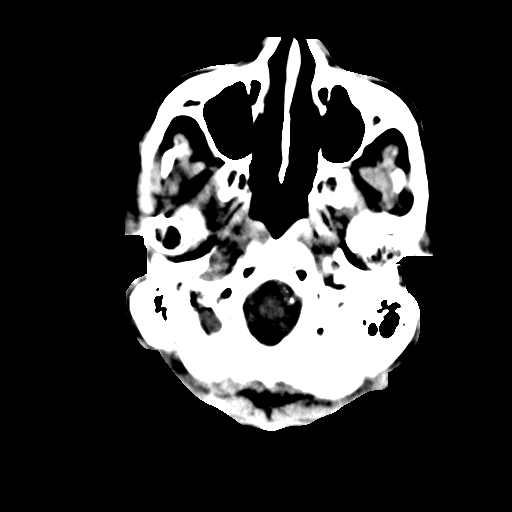
[im 4/31  bone]
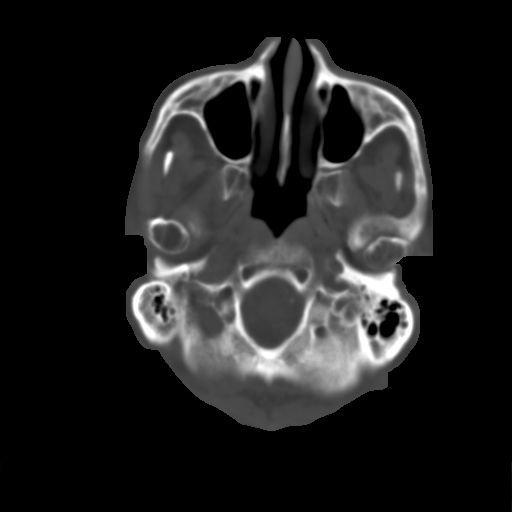
[im 7/31  brain]
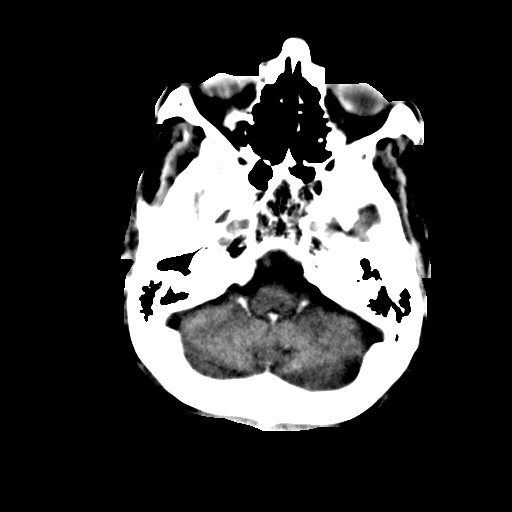
[im 11/31  brain]
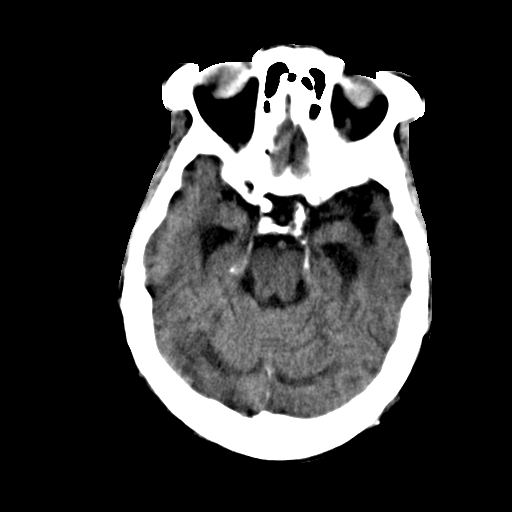
[im 14/31  brain]
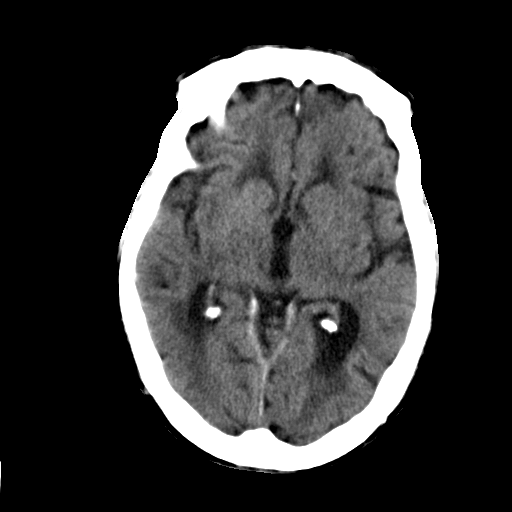
[im 17/31  brain]
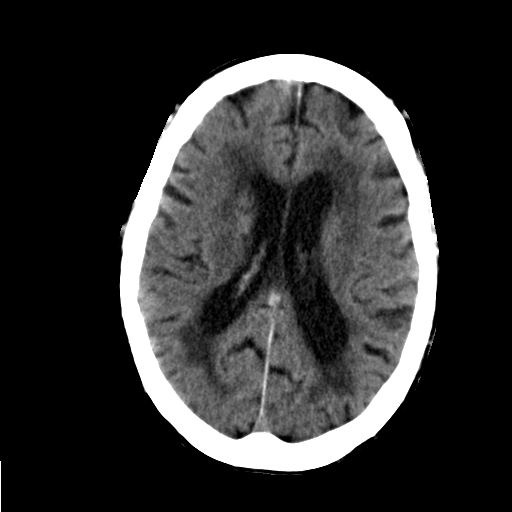
[im 17/31  bone]
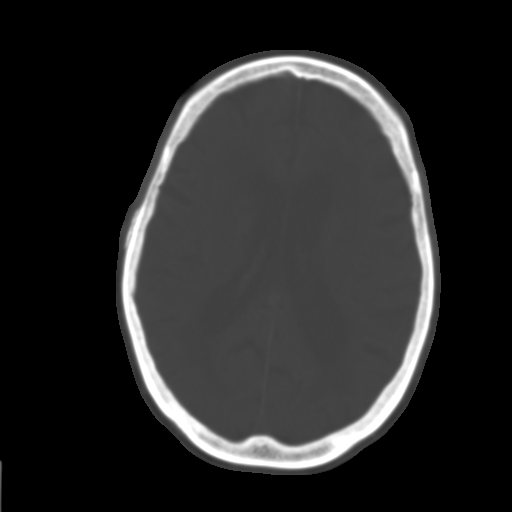
[im 21/31  brain]
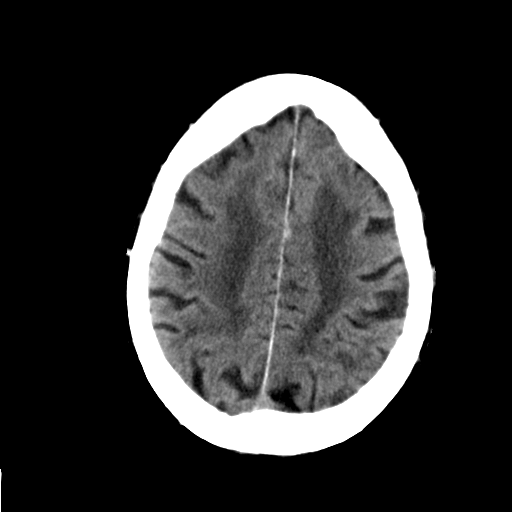
[im 24/31  brain]
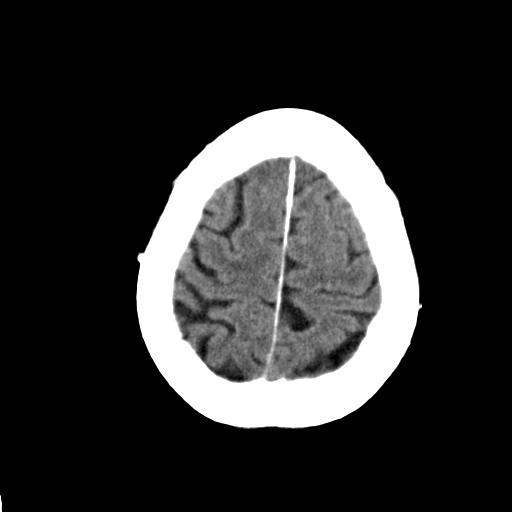
[im 27/31  brain]
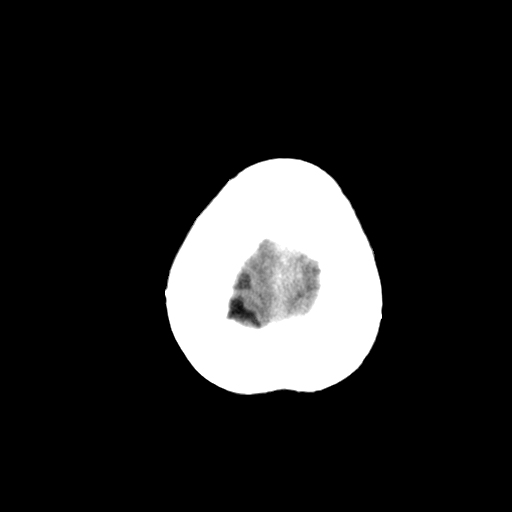

[Series 4: soft tissue recon · axial · 0.43mm/px · z∈[+366,+468]mm · 8 of 28 slices shown]
[im 4/28  brain]
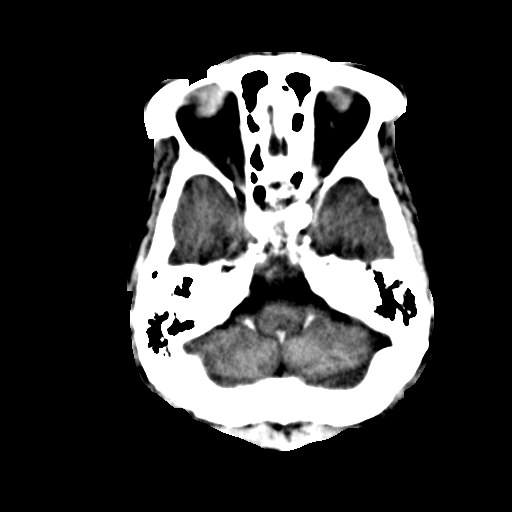
[im 7/28  brain]
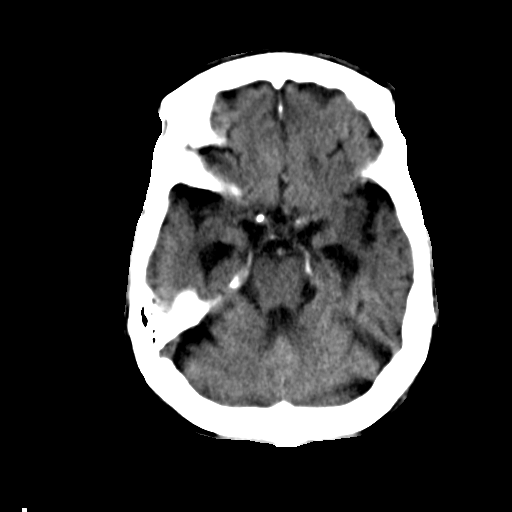
[im 10/28  brain]
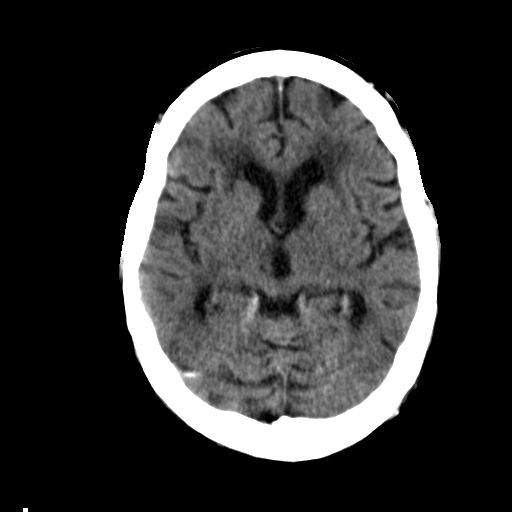
[im 13/28  brain]
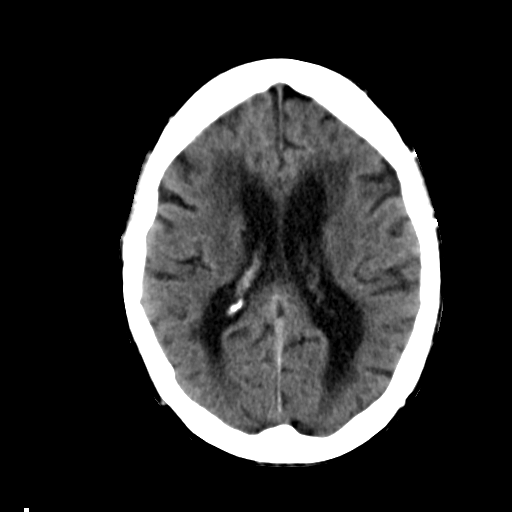
[im 16/28  brain]
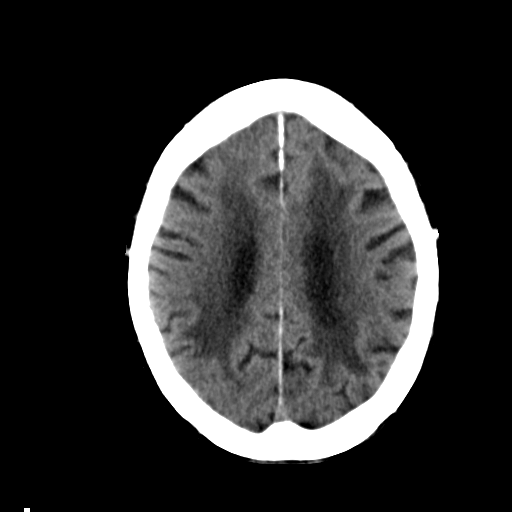
[im 19/28  brain]
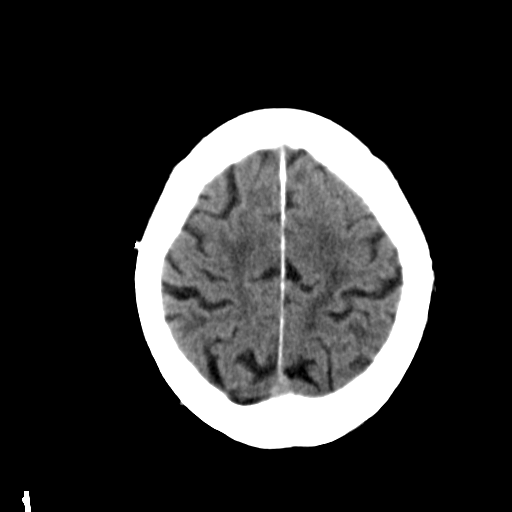
[im 22/28  brain]
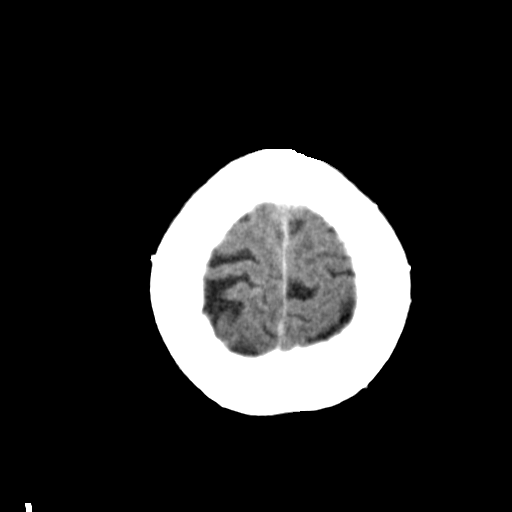
[im 25/28  brain]
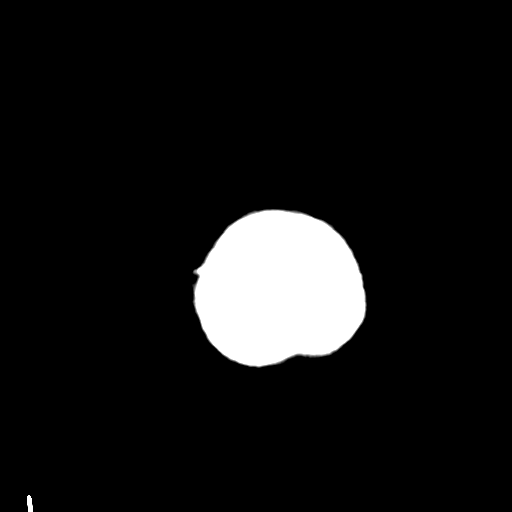

[16 of 30 positions shown; findings below may reference images not displayed]

FINDINGS: Again noted is generalized brain atrophy with commensurate
dilatation of the ventricles and sulci. Confluent areas of low
density are seen throughout the bilateral periventricular and
subcortical white matter regions compatible with chronic small
vessel ischemia. Old lacunar infarct noted within the right
thalamus. There are chronic calcified atherosclerotic changes of the
large vessels at the skull base.

There is no mass, hemorrhage, edema, or other evidence of acute
parenchymal abnormality. No extra-axial hemorrhage. No osseous
fracture or dislocation. Paranasal sinuses are clear. Mastoid air
cells are clear. Superficial soft tissues are unremarkable.
IMPRESSION: 1. Atrophy and chronic ischemic changes as detailed above.
2. No evidence of acute intracranial abnormality. No intracranial
mass, hemorrhage, or edema.

## 2017-07-28 IMAGING — CR DG FOOT COMPLETE 3+V*R*
1 series · 3 of 3 positions shown · non-contrast
Comparison: None.

CLINICAL DATA: Discoloration at the right toes, acute onset.
Initial encounter.

EXAM:
RIGHT FOOT COMPLETE - 3+ VIEW

[Series 1: ap · 0.17mm/px · 3 of 3 slices shown]
[im 1/3]
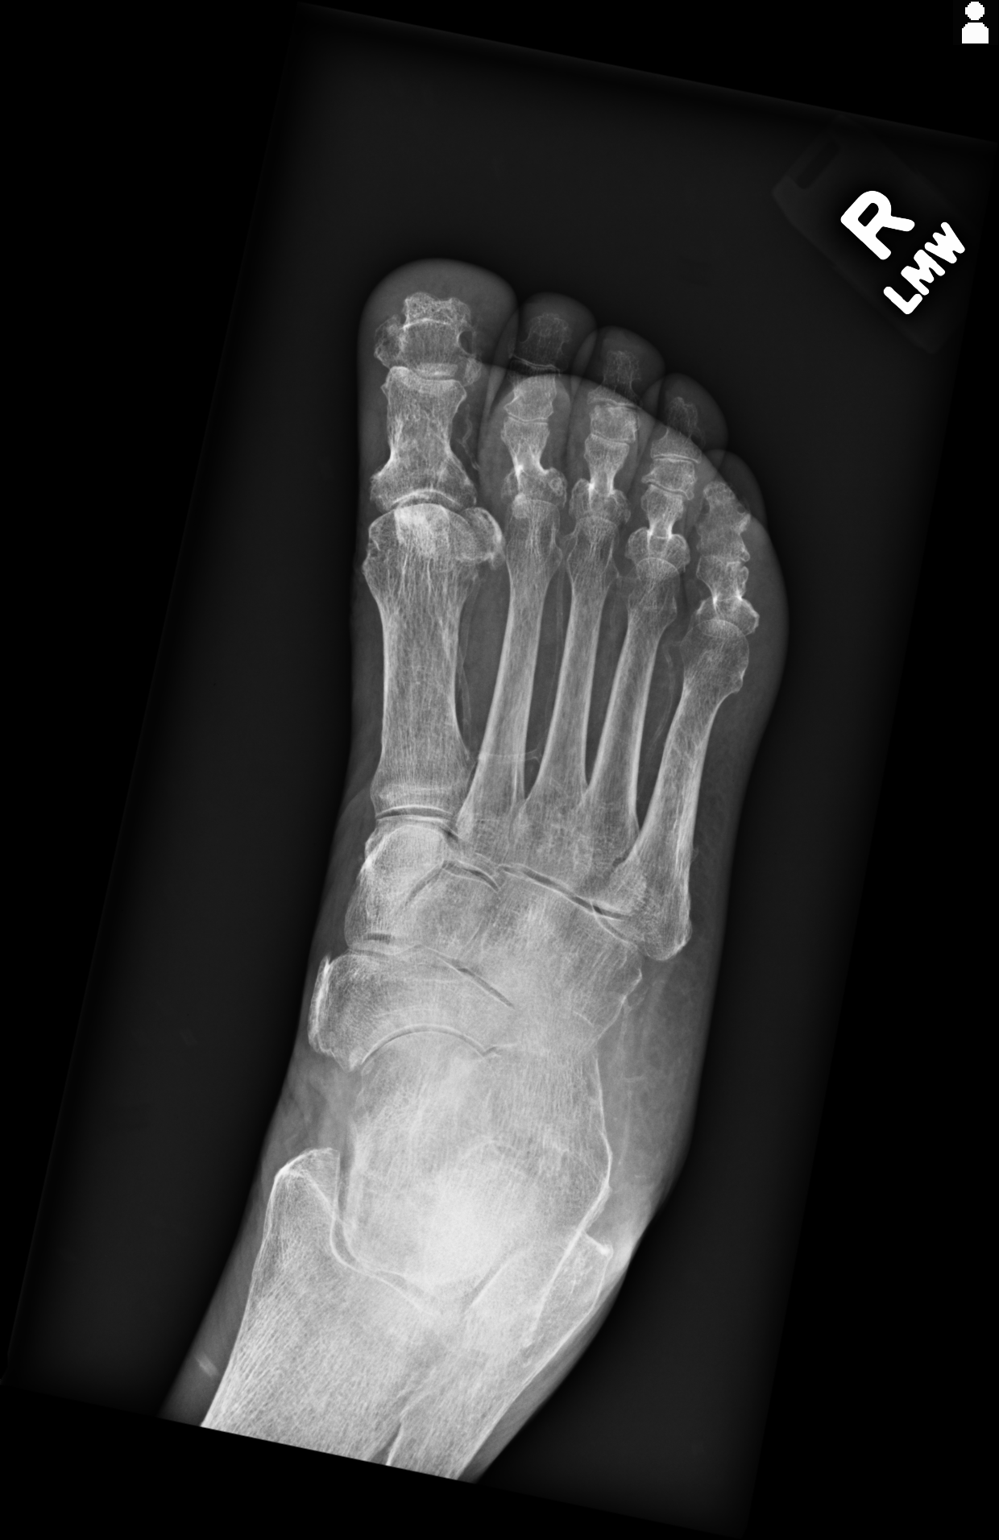
[im 2/3]
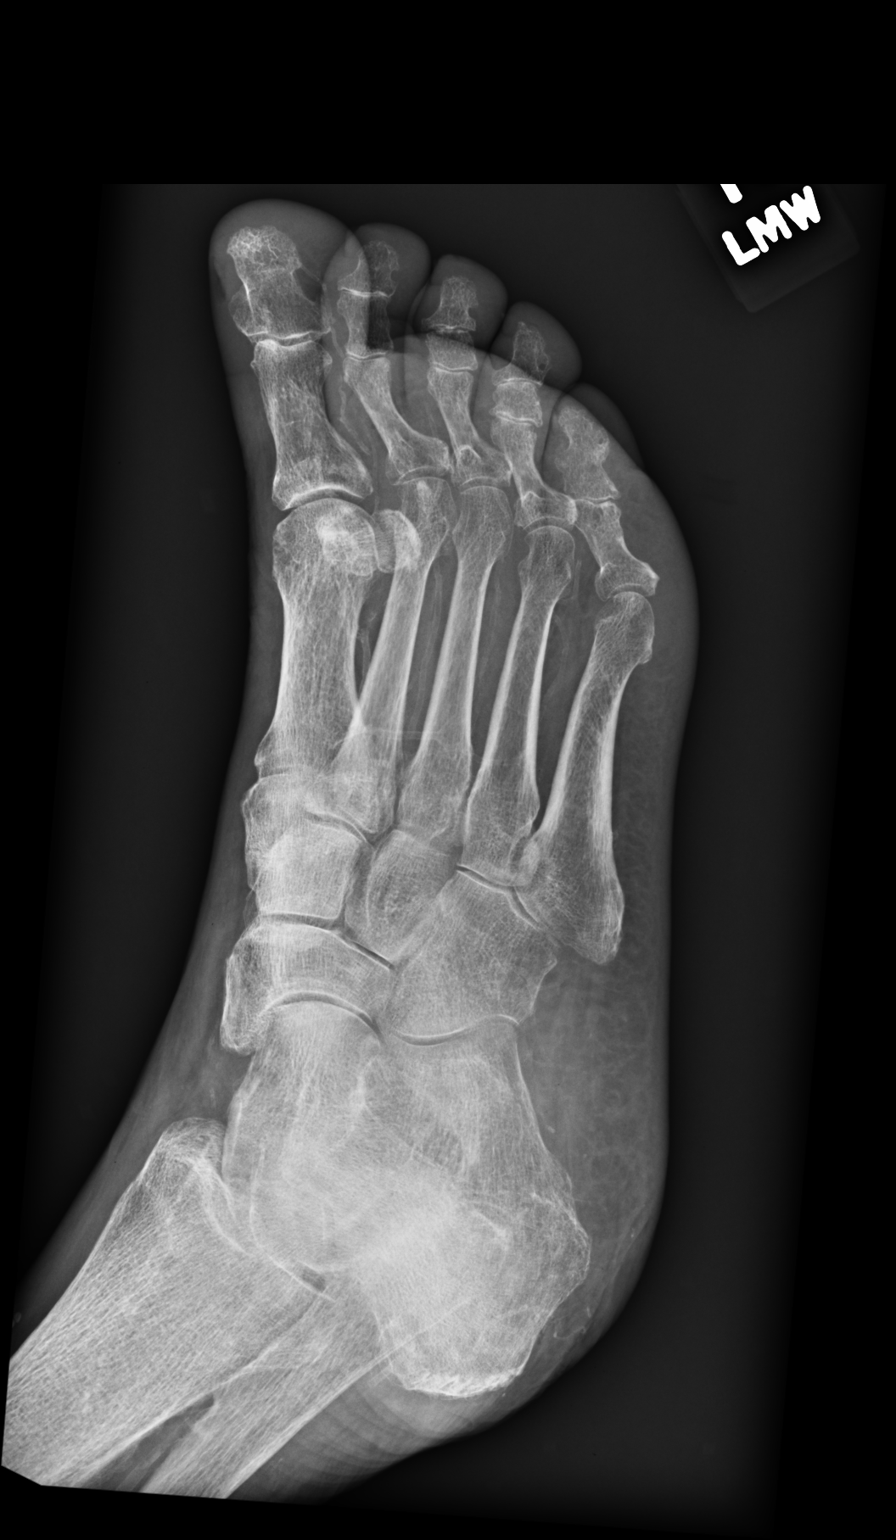
[im 3/3]
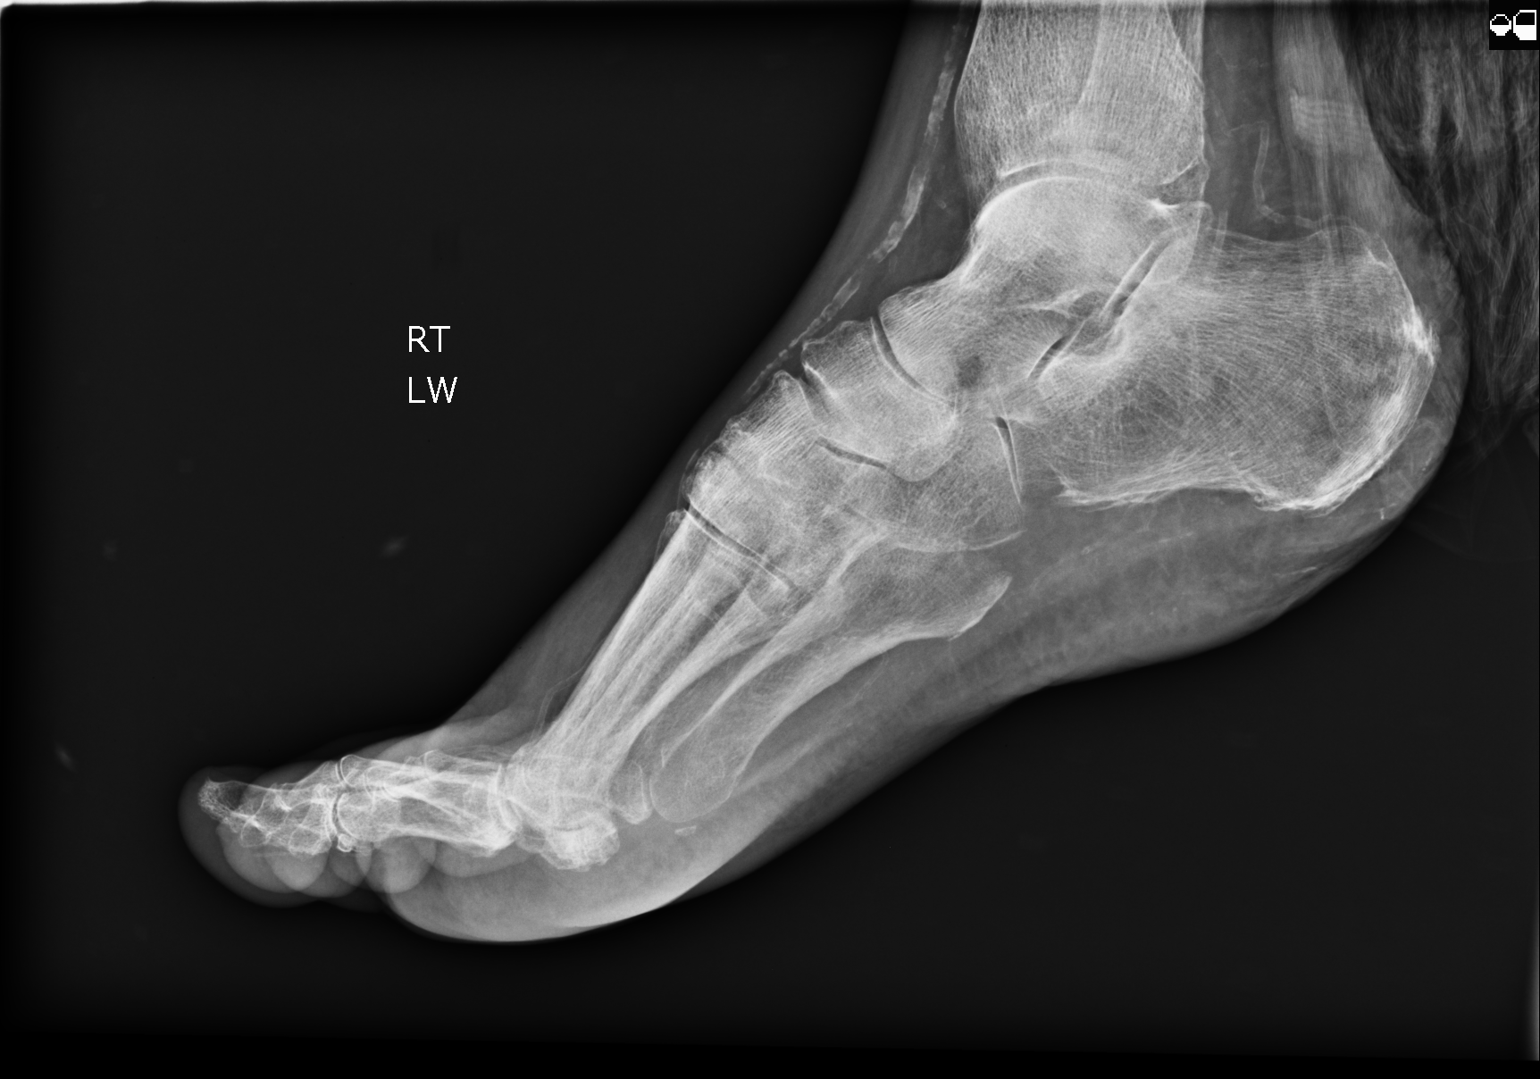

[3 of 3 positions shown; findings below may reference images not displayed]

FINDINGS: There is no evidence of fracture or dislocation. The joint spaces
are preserved. There is no evidence of talar subluxation; the
subtalar joint is unremarkable in appearance.

Diffuse vascular calcifications are seen.
IMPRESSION: 1. No evidence of fracture or dislocation.
2. Diffuse vascular calcifications seen.
